# Patient Record
Sex: Female | Born: 1995 | Hispanic: No | Marital: Single | Smoking: Current every day smoker
Health system: Southern US, Community
[De-identification: ages and names within clinical notes are randomized; demographics above are authoritative.]

## PROBLEM LIST (undated history)

## (undated) ENCOUNTER — Inpatient Hospital Stay (HOSPITAL_COMMUNITY): Payer: Self-pay

## (undated) DIAGNOSIS — Z8759 Personal history of other complications of pregnancy, childbirth and the puerperium: Secondary | ICD-10-CM

## (undated) HISTORY — DX: Personal history of other complications of pregnancy, childbirth and the puerperium: Z87.59

## (undated) HISTORY — PX: OTHER SURGICAL HISTORY: SHX169

---

## 2016-11-08 ENCOUNTER — Encounter (HOSPITAL_COMMUNITY): Payer: Self-pay | Admitting: *Deleted

## 2016-11-08 ENCOUNTER — Inpatient Hospital Stay (HOSPITAL_COMMUNITY)
Admission: AD | Admit: 2016-11-08 | Discharge: 2016-11-08 | Disposition: A | Discharge status: Home / Self Care | Payer: Self-pay | Source: Ambulatory Visit | Attending: Obstetrics and Gynecology | Admitting: Obstetrics and Gynecology

## 2016-11-08 DIAGNOSIS — F17200 Nicotine dependence, unspecified, uncomplicated: Secondary | ICD-10-CM | POA: Insufficient documentation

## 2016-11-08 DIAGNOSIS — Z880 Allergy status to penicillin: Secondary | ICD-10-CM | POA: Insufficient documentation

## 2016-11-08 DIAGNOSIS — N730 Acute parametritis and pelvic cellulitis: Secondary | ICD-10-CM

## 2016-11-08 DIAGNOSIS — B9689 Other specified bacterial agents as the cause of diseases classified elsewhere: Secondary | ICD-10-CM

## 2016-11-08 DIAGNOSIS — N760 Acute vaginitis: Secondary | ICD-10-CM

## 2016-11-08 DIAGNOSIS — N739 Female pelvic inflammatory disease, unspecified: Secondary | ICD-10-CM | POA: Insufficient documentation

## 2016-11-08 DIAGNOSIS — K5900 Constipation, unspecified: Secondary | ICD-10-CM | POA: Insufficient documentation

## 2016-11-08 LAB — WET PREP, GENITAL
SPERM: NONE SEEN
TRICH WET PREP: NONE SEEN
YEAST WET PREP: NONE SEEN

## 2016-11-08 LAB — URINALYSIS, ROUTINE W REFLEX MICROSCOPIC
Bilirubin Urine: NEGATIVE
GLUCOSE, UA: NEGATIVE mg/dL
Hgb urine dipstick: NEGATIVE
KETONES UR: NEGATIVE mg/dL
Nitrite: POSITIVE — AB
PH: 7 (ref 5.0–8.0)
Protein, ur: NEGATIVE mg/dL
SPECIFIC GRAVITY, URINE: 1.015 (ref 1.005–1.030)

## 2016-11-08 LAB — POCT PREGNANCY, URINE: Preg Test, Ur: NEGATIVE

## 2016-11-08 MED ORDER — CEFTRIAXONE SODIUM 250 MG IJ SOLR
250.0000 mg | Freq: Once | INTRAMUSCULAR | Status: AC
  Administered 2016-11-08: 250 mg via INTRAMUSCULAR
  Filled 2016-11-08: qty 250

## 2016-11-08 MED ORDER — AZITHROMYCIN 250 MG PO TABS
1000.0000 mg | ORAL_TABLET | Freq: Once | ORAL | Status: AC
  Administered 2016-11-08: 1000 mg via ORAL
  Filled 2016-11-08: qty 4

## 2016-11-08 MED ORDER — AZITHROMYCIN 500 MG PO TABS: 1000.0000 mg | ORAL_TABLET | Freq: Once | ORAL | 0 refills | Status: AC

## 2016-11-08 MED ORDER — METRONIDAZOLE 500 MG PO TABS: 500.0000 mg | ORAL_TABLET | Freq: Two times a day (BID) | ORAL | 0 refills | Status: AC

## 2016-11-08 NOTE — MAU Note (Signed)
Pt presents to MAU with Periods have been light and irregular. Last regular period Jan 1.   Noticed an increase in d/c x 1 week and nipples are leaking and hurt.   Lower left and right abdominal pain.  Feels like someone is poking

## 2016-11-08 NOTE — Discharge Instructions (Signed)
Bacterial Vaginosis Bacterial vaginosis is an infection of the vagina. It happens when too many germs (bacteria) grow in the vagina. This infection puts you at risk for infections from sex (STIs). Treating this infection can lower your risk for some STIs. You should also treat this if you are pregnant. It can cause your baby to be born early. Follow these instructions at home: Medicines   Take over-the-counter and prescription medicines only as told by your doctor.  Take or use your antibiotic medicine as told by your doctor. Do not stop taking or using it even if you start to feel better. General instructions   If you your sexual partner is a woman, tell her that you have this infection. She needs to get treatment if she has symptoms. If you have a female partner, he does not need to be treated.  During treatment:  Avoid sex.  Do not douche.  Avoid alcohol as told.  Avoid breastfeeding as told.  Drink enough fluid to keep your pee (urine) clear or pale yellow.  Keep your vagina and butt (rectum) clean.  Wash the area with warm water every day.  Wipe from front to back after you use the toilet.  Keep all follow-up visits as told by your doctor. This is important. Preventing this condition   Do not douche.  Use only warm water to wash around your vagina.  Use protection when you have sex. This includes:  Latex condoms.  Dental dams.  Limit how many people you have sex with. It is best to only have sex with the same person (be monogamous).  Get tested for STIs. Have your partner get tested.  Wear underwear that is cotton or lined with cotton.  Avoid tight pants and pantyhose. This is most important in summer.  Do not use any products that have nicotine or tobacco in them. These include cigarettes and e-cigarettes. If you need help quitting, ask your doctor.  Do not use illegal drugs.  Limit how much alcohol you drink. Contact a doctor if:  Your symptoms do not  get better, even after you are treated.  You have more discharge or pain when you pee (urinate).  You have a fever.  You have pain in your belly (abdomen).  You have pain with sex.  Your bleed from your vagina between periods. Summary  This infection happens when too many germs (bacteria) grow in the vagina.  Treating this condition can lower your risk for some infections from sex (STIs).  You should also treat this if you are pregnant. It can cause early (premature) birth.  Do not stop taking or using your antibiotic medicine even if you start to feel better. This information is not intended to replace advice given to you by your health care provider. Make sure you discuss any questions you have with your health care provider. Document Released: 04/29/2008 Document Revised: 04/05/2016 Document Reviewed: 04/05/2016 Elsevier Interactive Patient Education  2017 Elsevier Inc.  Pelvic Inflammatory Disease Pelvic inflammatory disease (PID) is an infection in some or all of the female organs. PID can be in the uterus, ovaries, fallopian tubes, or the surrounding tissues that are inside the lower belly area (pelvis). PID can lead to lasting problems if it is not treated. To check for this disease, your doctor may:  Do a physical exam.  Do blood tests, urine tests, or a pregnancy test.  Look at your vaginal discharge.  Do tests to look inside the pelvis.  Test you for  other infections. Follow these instructions at home:  Take over-the-counter and prescription medicines only as told by your doctor.  If you were prescribed an antibiotic medicine, take it as told by your doctor. Do not stop taking it even if you start to feel better.  Do not have sex until treatment is done or as told by your doctor.  Tell your sex partner if you have PID. Your partner may need to be treated.  Keep all follow-up visits as told by your doctor. This is important.  Your doctor may test you for  infection again 3 months after you are treated. Contact a doctor if:  You have more fluid (discharge) coming from your vagina or fluid that is not normal.  Your pain does not improve.  You throw up (vomit).  You have a fever.  You cannot take your medicines.  Your partner has a sexually transmitted disease (STD).  You have pain when you pee (urinate). Get help right away if:  You have more belly (abdominal) or lower belly pain.  You have chills.  You are not better after 72 hours. This information is not intended to replace advice given to you by your health care provider. Make sure you discuss any questions you have with your health care provider. Document Released: 10/17/2008 Document Revised: 12/27/2015 Document Reviewed: 08/28/2014 Elsevier Interactive Patient Education  2017 [employer].

## 2016-11-08 NOTE — MAU Provider Note (Signed)
History     CSN: [id number]  Arrival date and time: 11/08/16 1059   First Provider Initiated Contact with Patient 11/08/16 1150      Chief Complaint  Patient presents with  . Abdominal Pain   Patient is a 21 year old G2 P1 presents today with concerns for pregnancy. Her pregnancy test however was negative. She reports she's been lactating for the past 3-4 days and has some abdominal distention as well as lower abdominal pain. She does complain of vaginal discharge. She states she's dissecting sexually active with multiple partners and does not use protection. She says she is not on any birth control because she lost custody of her first child and would like to have a child. She does report she has significant amount of stress in her life right now. Reports her discharge is white thin and potentially trichomoniasis. She has had this before. Additionally she states she like to be checked for gonorrhea and chlamydia as she is concerned she may have these.    OB History    Gravida Para Term Preterm AB Living   0 1 0   SAB TAB Ectopic Multiple Live Births       1   1      History reviewed. No pertinent past medical history.  Past Surgical History:  Procedure Laterality Date  . thumb surgery      No family history on file.  Social History  Substance Use Topics  . Smoking status: Current Every Day Smoker  . Smokeless tobacco: Never Used  . Alcohol use No    Allergies:  Allergies  Allergen Reactions  . Amoxicillin Anaphylaxis and Other (See Comments)    Has patient had a PCN reaction causing immediate rash, facial/tongue/throat swelling, SOB or lightheadedness with hypotension: Yes Has patient had a PCN reaction causing severe rash involving mucus membranes or skin necrosis: No Has patient had a PCN reaction that required hospitalization No Has patient had a PCN reaction occurring within the last 10 years: No If all of the above answers are "NO", then may proceed with  Cephalosporin use.  Marland Kitchen Penicillins Anaphylaxis and Other (See Comments)    Has patient had a PCN reaction causing immediate rash, facial/tongue/throat swelling, SOB or lightheadedness with hypotension: Yes Has patient had a PCN reaction causing severe rash involving mucus membranes or skin necrosis: No Has patient had a PCN reaction that required hospitalization No Has patient had a PCN reaction occurring within the last 10 years: No If all of the above answers are "NO", then may proceed with Cephalosporin use.    No prescriptions prior to admission.    Review of Systems  Constitutional: Negative for activity change, appetite change, chills and fever.  HENT: Negative for congestion and rhinorrhea.   Respiratory: Negative for cough and shortness of breath.   Cardiovascular: Negative for chest pain and palpitations.  Gastrointestinal: Positive for abdominal pain and constipation. Negative for abdominal distention, diarrhea, nausea and vomiting.  Genitourinary: Negative for difficulty urinating, dysuria and frequency.  Neurological: Negative for dizziness and weakness.   Physical Exam   Blood pressure 107/71, pulse 79, height  (1.626 m), weight 136 lb (61.7 kg), last menstrual period 08/04/2016.  Physical Exam  Constitutional: She is oriented to person, place, and time. She appears well-developed and well-nourished.  HENT:  Head: Normocephalic and atraumatic.  Cardiovascular: Normal rate and intact distal pulses.   Respiratory: Effort normal. No respiratory distress.  GI: Soft. She exhibits no distension.  There is tenderness. There is no rebound and no guarding.  Genitourinary:  Genitourinary Comments: Patient with cervical motion tenderness, copious thin white discharge, no vaginal bleeding, cervical os mildly irritated.  Neurological: She is alert and oriented to person, place, and time.  Skin: Skin is warm and dry.  Psychiatric: She has a normal mood and affect. Her behavior  is normal.    MAU Course  Procedures  MDM In MA U patient underwent evaluation for pregnancy. Pregnancy test was negative. Additionally she went underwent evaluation for PID with concerns for STDs and lower abdominal pain. She did have cervical motion tenderness and received a diagnosis of pelvic inflammatory disease. She was given a gram of azithromycin in 250 mg of Rocephin in the ER. She was prescribed an additional gram of azithromycin to take in 1 week.  Was performed and did reveal bacterial vaginosis. She was given 500 mg of Flagyl twice a day for 1 week.  GC and chlamydia pending.  Patient did also appear to have constipation and was instructed to get MiraLAX over-the-counter and take daily for several days.  Assessment and Plan  Pelvic inflammatory disease: Treat with azithromycin 1 g 21 week apart and Rocephin IM. BV: The with Flagyl 500 mg twice a day for 1 week Constipation: Treat with MiraLAX over-the-counter PRN  Anne Mcclain 11/08/2016, 12:27 PM

## 2016-11-10 LAB — GC/CHLAMYDIA PROBE AMP (~~LOC~~) NOT AT ARMC
CHLAMYDIA, DNA PROBE: NEGATIVE
NEISSERIA GONORRHEA: NEGATIVE

## 2016-12-24 NOTE — Congregational Nurse Program (Signed)
Congregational Nurse Program Note  Date of Encounter: 12/22/2016  Past Medical History: No past medical history on file.  Encounter Details:     CNP Questionnaire - 12/22/16 1626      Patient Demographics   Is this a new or existing patient? New   Patient is considered a/an Not Applicable   Race American Indian/[place] Native     Patient Assistance   Location of Patient Assistance Not Applicable   Patient's financial/insurance status Low Income;Medicaid   Uninsured Patient ([employer], [employer]Card/[employer]s) No   Patient referred to apply for the following financial assistance Not Applicable   Food insecurities addressed Not Applicable   Transportation assistance No   Assistance securing medications No   Educational health offerings Acute disease;Navigating the healthcare system;Safety     Encounter Details   Primary purpose of visit Acute Illness/Condition Visit;Navigating the Healthcare System   Was an Emergency Department visit averted? Not Applicable   Does patient have a medical provider? No   Patient referred to Area Agency   Was a mental health screening completed? (GAINS tool) No   Does patient have dental issues? No   Does patient have vision issues? No   Does your patient have an abnormal blood pressure today? No   Since previous encounter, have you referred patient for abnormal blood pressure that resulted in a new diagnosis or medication change? No   Does your patient have an abnormal blood glucose today? No   Since previous encounter, have you referred patient for abnormal blood glucose that resulted in a new diagnosis or medication change? No   Was there a life-saving intervention made? No     Client referred to see me by the [facility] Director, Tommi EmeryMichael Pearson.  Client states she believes she is pregnant and has positive pregnancy test.  She is also complaining of lower abdominal pain.  Client denies being pregnant before and did not disclose that [facility].  Provided resources for client to make appointment with the [facility].  Instructed to return to see me for bus passes once the appointment was made.  Client did not return

## 2017-02-02 ENCOUNTER — Encounter (HOSPITAL_COMMUNITY): Payer: Self-pay | Admitting: *Deleted

## 2017-02-02 ENCOUNTER — Inpatient Hospital Stay (HOSPITAL_COMMUNITY)
Admission: AD | Admit: 2017-02-02 | Discharge: 2017-02-02 | Discharge status: Against Medical Advice | Payer: Medicaid Other | Source: Ambulatory Visit | Attending: Obstetrics & Gynecology | Admitting: Obstetrics & Gynecology

## 2017-02-02 DIAGNOSIS — O26899 Other specified pregnancy related conditions, unspecified trimester: Secondary | ICD-10-CM | POA: Insufficient documentation

## 2017-02-02 DIAGNOSIS — R109 Unspecified abdominal pain: Secondary | ICD-10-CM | POA: Insufficient documentation

## 2017-02-02 LAB — URINALYSIS, ROUTINE W REFLEX MICROSCOPIC
BILIRUBIN URINE: NEGATIVE
Glucose, UA: NEGATIVE mg/dL
Hgb urine dipstick: NEGATIVE
KETONES UR: 20 mg/dL — AB
Nitrite: NEGATIVE
PH: 5 (ref 5.0–8.0)
Protein, ur: 30 mg/dL — AB
Specific Gravity, Urine: 1.03 (ref 1.005–1.030)

## 2017-02-02 LAB — POCT PREGNANCY, URINE: PREG TEST UR: POSITIVE — AB

## 2017-02-02 NOTE — MAU Note (Signed)
Lab called patient to have labs drawn, pt not in lobby for first call.

## 2017-02-02 NOTE — MAU Note (Signed)
Pt not in lobby for third call.

## 2017-02-02 NOTE — MAU Note (Signed)
Pt not in lobby for 2nd call

## 2017-02-02 NOTE — MAU Note (Signed)
+  lower abdominal pain Sharp in nature and comes/goes Started 2 days ago Rating pain 6/10  LMP 11/02/2016  Denies vaginal bleeding or discharge

## 2017-02-09 ENCOUNTER — Inpatient Hospital Stay (HOSPITAL_COMMUNITY): Payer: Medicaid Other

## 2017-02-09 ENCOUNTER — Encounter (HOSPITAL_COMMUNITY): Payer: Self-pay | Admitting: *Deleted

## 2017-02-09 ENCOUNTER — Inpatient Hospital Stay (HOSPITAL_COMMUNITY)
Admission: AD | Admit: 2017-02-09 | Discharge: 2017-02-09 | Disposition: A | Discharge status: Home / Self Care | Payer: Medicaid Other | Source: Ambulatory Visit | Attending: Obstetrics & Gynecology | Admitting: Obstetrics & Gynecology

## 2017-02-09 DIAGNOSIS — O23512 Infections of cervix in pregnancy, second trimester: Secondary | ICD-10-CM | POA: Insufficient documentation

## 2017-02-09 DIAGNOSIS — O26892 Other specified pregnancy related conditions, second trimester: Secondary | ICD-10-CM | POA: Insufficient documentation

## 2017-02-09 DIAGNOSIS — N72 Inflammatory disease of cervix uteri: Secondary | ICD-10-CM | POA: Insufficient documentation

## 2017-02-09 DIAGNOSIS — Z3A14 14 weeks gestation of pregnancy: Secondary | ICD-10-CM | POA: Insufficient documentation

## 2017-02-09 DIAGNOSIS — O26899 Other specified pregnancy related conditions, unspecified trimester: Secondary | ICD-10-CM

## 2017-02-09 DIAGNOSIS — R109 Unspecified abdominal pain: Secondary | ICD-10-CM | POA: Insufficient documentation

## 2017-02-09 LAB — URINALYSIS, ROUTINE W REFLEX MICROSCOPIC
BILIRUBIN URINE: NEGATIVE
GLUCOSE, UA: NEGATIVE mg/dL
Ketones, ur: NEGATIVE mg/dL
NITRITE: NEGATIVE
PROTEIN: NEGATIVE mg/dL
SPECIFIC GRAVITY, URINE: 1.003 — AB (ref 1.005–1.030)
pH: 7 (ref 5.0–8.0)

## 2017-02-09 LAB — CBC WITH DIFFERENTIAL/PLATELET
Basophils Absolute: 0 10*3/uL (ref 0.0–0.1)
Basophils Relative: 0 %
EOS ABS: 0.1 10*3/uL (ref 0.0–0.7)
EOS PCT: 1 %
HCT: 29.3 % — ABNORMAL LOW (ref 36.0–46.0)
Hemoglobin: 9.8 g/dL — ABNORMAL LOW (ref 12.0–15.0)
LYMPHS ABS: 1.9 10*3/uL (ref 0.7–4.0)
LYMPHS PCT: 20 %
MCH: 30.2 pg (ref 26.0–34.0)
MCHC: 33.4 g/dL (ref 30.0–36.0)
MCV: 90.2 fL (ref 78.0–100.0)
MONO ABS: 0.4 10*3/uL (ref 0.1–1.0)
Monocytes Relative: 4 %
Neutro Abs: 7 10*3/uL (ref 1.7–7.7)
Neutrophils Relative %: 75 %
PLATELETS: 200 10*3/uL (ref 150–400)
RBC: 3.25 MIL/uL — AB (ref 3.87–5.11)
RDW: 15.3 % (ref 11.5–15.5)
WBC: 9.4 10*3/uL (ref 4.0–10.5)

## 2017-02-09 LAB — WET PREP, GENITAL
Clue Cells Wet Prep HPF POC: NONE SEEN
Sperm: NONE SEEN
Trich, Wet Prep: NONE SEEN
YEAST WET PREP: NONE SEEN

## 2017-02-09 MED ORDER — OXYCODONE-ACETAMINOPHEN 5-325 MG PO TABS
1.0000 | ORAL_TABLET | Freq: Once | ORAL | Status: AC
  Administered 2017-02-09: 1 via ORAL
  Filled 2017-02-09: qty 1

## 2017-02-09 MED ORDER — CEFTRIAXONE SODIUM 250 MG IJ SOLR
250.0000 mg | Freq: Once | INTRAMUSCULAR | Status: AC
  Administered 2017-02-09: 250 mg via INTRAMUSCULAR
  Filled 2017-02-09: qty 250

## 2017-02-09 MED ORDER — AZITHROMYCIN 250 MG PO TABS
1000.0000 mg | ORAL_TABLET | Freq: Once | ORAL | Status: AC
  Administered 2017-02-09: 1000 mg via ORAL
  Filled 2017-02-09: qty 4

## 2017-02-09 NOTE — Discharge Instructions (Signed)
Abdominal Pain During Pregnancy Abdominal pain is common in pregnancy. Most of the time, it does not cause harm. There are many causes of abdominal pain. Some causes are more serious than others and sometimes the cause is not known. Abdominal pain can be a sign that something is very wrong with the pregnancy or the pain may have nothing to do with the pregnancy. Always tell your health care provider if you have any abdominal pain. Follow these instructions at home:  Do not have sex or put anything in your vagina until your symptoms go away completely.  Watch your abdominal pain for any changes.  Get plenty of rest until your pain improves.  Drink enough fluid to keep your urine clear or pale yellow.  Take over-the-counter or prescription medicines only as told by your health care provider.  Keep all follow-up visits as told by your health care provider. This is important. Contact a health care provider if:  You have a fever.  Your pain gets worse or you have cramping.  Your pain continues after resting. Get help right away if:  You are bleeding, leaking fluid, or passing tissue from the vagina.  You have vomiting or diarrhea that does not go away.  You have painful or bloody urination.  You notice a decrease in your baby's movements.  You feel very weak or faint.  You have shortness of breath.  You develop a severe headache with abdominal pain.  You have abnormal vaginal discharge with abdominal pain. This information is not intended to replace advice given to you by your health care provider. Make sure you discuss any questions you have with your health care provider. Document Released: 07/21/2005 Document Revised: 05/01/2016 Document Reviewed: 02/17/2013 Elsevier Interactive Patient Education  2018 [employer]  Cervicitis Cervicitis is when the cervix gets irritated and swollen. Your cervix is the lower end of your uterus. Follow these instructions at home:  Do not  have sex until your doctor says it is okay.  Take over-the-counter and prescription medicines only as told by your doctor.  If you were prescribed an antibiotic medicine, take it as told by your doctor. Do not stop taking it even if you start to feel better.  Keep all follow-up visits as told by your doctor. This is important. Contact a doctor if:  Your symptoms come back after treatment.  Your symptoms get worse after treatment.  You have a fever.  You feel tired (fatigued).  Your belly (abdomen) hurts.  You feel like you are going to throw up (are nauseous).  You throw up (vomit).  You have watery poop (diarrhea).  Your back hurts. Get help right away if:  You have very bad pain in your belly, and medicine does not help it.  You cannot pee (urinate). Summary  Cervicitis is when the cervix gets irritated and swollen.  Do not have sex until your doctor says it is okay.  If you need to take an antibiotic, do not stop taking even if you start to feel better. Take medicines only as told by your doctor. This information is not intended to replace advice given to you by your health care provider. Make sure you discuss any questions you have with your health care provider. Document Released: 04/29/2008 Document Revised: 04/06/2016 Document Reviewed: 04/06/2016 Elsevier Interactive Patient Education  2017 [employer]

## 2017-02-09 NOTE — MAU Note (Signed)
Question pt regarding history of Penicillin allergy and prior receiving Rocephin injection. Pt denied getting injection. Pt. Stepped out into hallway and confirmed that she did get a Rocephin injection back in April 2018, did not want significant other in the room to know. No reaction at that time.

## 2017-02-09 NOTE — MAU Note (Signed)
Pt here with c/o right sided-abdominal pain started about 0300; Denies any bleeding; frequent urination, pressure.

## 2017-02-09 NOTE — MAU Provider Note (Signed)
History     CSN: [id number]  Arrival date and time: 02/09/17 4098   First Provider Initiated Contact with Patient 02/09/17 [phone]      Chief Complaint  Patient presents with  . Abdominal Pain    right side   HPI Anne Mcclain is a 21 y.o. G3P1010 at [redacted]w[redacted]d who presents with lower abdominal pain that woke her out of her sleep at 0300. She states the pain is mostly on the right side but also all along the bottom part of her abdomen. She denies any vaginal bleeding or leaking of fluid. She reports urinary frequency.   Says she was treated for PID in April 2018 however did not complete the recommended treatment.   OB History    Gravida Para Term Preterm AB Living   3 1 1  0 1 0   SAB TAB Ectopic Multiple Live Births       1   1      History reviewed. No pertinent past medical history.  Past Surgical History:  Procedure Laterality Date  . thumb surgery      History reviewed. No pertinent family history.  Social History  Substance Use Topics  . Smoking status: Current Every Day Smoker    Types: Cigarettes  . Smokeless tobacco: Never Used  . Alcohol use No    Allergies:  Allergies  Allergen Reactions  . Amoxicillin Anaphylaxis and Other (See Comments)    Has patient had a PCN reaction causing immediate rash, facial/tongue/throat swelling, SOB or lightheadedness with hypotension: Yes Has patient had a PCN reaction causing severe rash involving mucus membranes or skin necrosis: No Has patient had a PCN reaction that required hospitalization No Has patient had a PCN reaction occurring within the last 10 years: No If all of the above answers are "NO", then may proceed with Cephalosporin use.  Marland Kitchen Penicillins Anaphylaxis and Other (See Comments)    Has patient had a PCN reaction causing immediate rash, facial/tongue/throat swelling, SOB or lightheadedness with hypotension: Yes Has patient had a PCN reaction causing severe rash involving mucus membranes or skin necrosis:  No Has patient had a PCN reaction that required hospitalization No Has patient had a PCN reaction occurring within the last 10 years: No If all of the above answers are "NO", then may proceed with Cephalosporin use. Pt reports receiving rocephin in the past with no reactions    Prescriptions Prior to Admission  Medication Sig Dispense Refill Last Dose  . aspirin 81 MG chewable tablet Chew by mouth daily.   02/09/2017 at Unknown time  . pseudoephedrine-acetaminophen (TYLENOL SINUS) 30-500 MG TABS tablet Take 1 tablet by mouth every 4 (four) hours as needed.       Review of Systems  Constitutional: Negative.  Negative for chills and fever.  HENT: Negative.   Respiratory: Negative.  Negative for shortness of breath.   Cardiovascular: Negative.  Negative for chest pain.  Gastrointestinal: Positive for abdominal pain. Negative for constipation, diarrhea, nausea and vomiting.  Genitourinary: Positive for frequency. Negative for dysuria, vaginal bleeding and vaginal discharge.  Neurological: Negative.  Negative for dizziness and headaches.  Psychiatric/Behavioral: Negative.    Physical Exam   Blood pressure 126/79, pulse 91, temperature 98 F (36.7 C), temperature source Oral, resp. rate 18, height 5\' 4"  (1.626 m), weight 134 lb (60.8 kg), last menstrual period 11/02/2016, SpO2 100 %.  Physical Exam  Nursing note and vitals reviewed. Constitutional: She appears well-developed and well-nourished.  HENT:  Head: Normocephalic and atraumatic.  Eyes: Conjunctivae are normal. No scleral icterus.  Cardiovascular: Normal rate, regular rhythm and normal heart sounds.   Respiratory: Effort normal and breath sounds normal. No respiratory distress.  GI: Soft. She exhibits no distension. There is tenderness. There is guarding.  Genitourinary: Cervix exhibits motion tenderness, discharge and friability. There is tenderness in the vagina. No bleeding in the vagina. Vaginal discharge found.   Neurological: She is alert.  Skin: Skin is warm and dry.  Psychiatric: She has a normal mood and affect. Her behavior is normal. Judgment and thought content normal.   Pelvic exam: Cervix with erythema and friability, visually closed, moderate white creamy discharge, vaginal walls and external genitalia normal Bimanual exam: Cervix 0/long/high, firm, anterior, positive CMT, uterus tender, adnexa with tenderness, no enlargement, or mass  FHT: 151 MAU Course  Procedures Results for orders placed or performed during the hospital encounter of 02/09/17 (from the past 24 hour(s))  Urinalysis, Routine w reflex microscopic     Status: Abnormal   Collection Time: 02/09/17  6:35 AM  Result Value Ref Range   Color, Urine STRAW (A) YELLOW   APPearance CLEAR CLEAR   Specific Gravity, Urine 1.003 (L) 1.005 - 1.030   pH 7.0 5.0 - 8.0   Glucose, UA NEGATIVE NEGATIVE mg/dL   Hgb urine dipstick LARGE (A) NEGATIVE   Bilirubin Urine NEGATIVE NEGATIVE   Ketones, ur NEGATIVE NEGATIVE mg/dL   Protein, ur NEGATIVE NEGATIVE mg/dL   Nitrite NEGATIVE NEGATIVE   Leukocytes, UA SMALL (A) NEGATIVE   RBC / HPF 0-5 0 - 5 RBC/hpf   WBC, UA 6-30 0 - 5 WBC/hpf   Bacteria, UA RARE (A) NONE SEEN   Squamous Epithelial / LPF 0-5 (A) NONE SEEN   Mucous PRESENT   CBC with Differential/Platelet     Status: Abnormal   Collection Time: 02/09/17  7:04 AM  Result Value Ref Range   WBC 9.4 4.0 - 10.5 K/uL   RBC 3.25 (L) 3.87 - 5.11 MIL/uL   Hemoglobin 9.8 (L) 12.0 - 15.0 g/dL   HCT 16.1 (L) 09.6 - 04.5 %   MCV 90.2 78.0 - 100.0 fL   MCH 30.2 26.0 - 34.0 pg   MCHC 33.4 30.0 - 36.0 g/dL   RDW 40.9 81.1 - 91.4 %   Platelets 200 150 - 400 K/uL   Neutrophils Relative % 75 %   Neutro Abs 7.0 1.7 - 7.7 K/uL   Lymphocytes Relative 20 %   Lymphs Abs 1.9 0.7 - 4.0 K/uL   Monocytes Relative 4 %   Monocytes Absolute 0.4 0.1 - 1.0 K/uL   Eosinophils Relative 1 %   Eosinophils Absolute 0.1 0.0 - 0.7 K/uL   Basophils Relative  0 %   Basophils Absolute 0.0 0.0 - 0.1 K/uL  Wet prep, genital     Status: Abnormal   Collection Time: 02/09/17  7:40 AM  Result Value Ref Range   Yeast Wet Prep HPF POC NONE SEEN NONE SEEN   Trich, Wet Prep NONE SEEN NONE SEEN   Clue Cells Wet Prep HPF POC NONE SEEN NONE SEEN   WBC, Wet Prep HPF POC MODERATE (A) NONE SEEN   Sperm NONE SEEN     No results found.  MDM UA CBC w/ Diff Wet prep and gc/chlamydia 1 percocet PO US OB Comp Less 14 weeks  Care turned over to J. Charmagne Buhl NP at 0800. Care resumed at 0800 Santiago Stenzel, Harolyn Rutherford, NP  Preliminary US shows no acute findings.  Azithromycin  1 gram given PO Rocephin 250 mg given IM.  Pain improved with Percocet.   Assessment and Plan    Cleone SlimCaroline Neill SNM 02/09/2017, 8:08 AM   A:  1. Cervicitis   2. Abdominal pain affecting pregnancy     P:  Discharge home in stable condition Condoms always Return to MAU if symptoms worsen Partner needs full STI assessment at the HD    Nasirah Sachs, Victorino DikeJennifer I, NP 02/09/2017 11:39 AM

## 2017-02-10 LAB — GC/CHLAMYDIA PROBE AMP (~~LOC~~) NOT AT ARMC
CHLAMYDIA, DNA PROBE: NEGATIVE
NEISSERIA GONORRHEA: NEGATIVE

## 2017-02-11 LAB — CULTURE, OB URINE

## 2017-02-12 ENCOUNTER — Other Ambulatory Visit: Payer: Self-pay | Admitting: Obstetrics & Gynecology

## 2017-02-12 ENCOUNTER — Other Ambulatory Visit (HOSPITAL_COMMUNITY): Payer: Self-pay | Admitting: Advanced Practice Midwife

## 2017-02-12 DIAGNOSIS — A498 Other bacterial infections of unspecified site: Secondary | ICD-10-CM

## 2017-02-12 MED ORDER — SULFAMETHOXAZOLE-TRIMETHOPRIM 800-160 MG PO TABS: 1.0000 | ORAL_TABLET | Freq: Two times a day (BID) | ORAL | 1 refills | Status: DC

## 2017-02-12 NOTE — Progress Notes (Unsigned)
+  UTI enterobacter  Had Rocephin  Sens to Bactrim  Will Rx Bactrim

## 2017-02-16 ENCOUNTER — Telehealth: Payer: Self-pay

## 2017-02-16 NOTE — Telephone Encounter (Signed)
Call patient no answer or voice mail to leave a message.  Bactrim DS prescribed for patient for UTI. Please call to inform patient of results and advise her to pick up prescription.

## 2017-03-03 NOTE — Telephone Encounter (Signed)
I called Adela LankJacqueline and left a message I am calling re: a prescription that was sent in and we need to discuss with you. Please call our office.

## 2017-03-04 ENCOUNTER — Encounter: Payer: Self-pay | Admitting: *Deleted

## 2017-03-04 NOTE — Telephone Encounter (Signed)
Letter to patient

## 2017-03-20 ENCOUNTER — Inpatient Hospital Stay (HOSPITAL_COMMUNITY)
Admission: AD | Admit: 2017-03-20 | Discharge: 2017-03-20 | Disposition: A | Discharge status: Home / Self Care | Payer: Medicaid Other | Source: Ambulatory Visit | Attending: Obstetrics and Gynecology | Admitting: Obstetrics and Gynecology

## 2017-03-20 ENCOUNTER — Encounter (HOSPITAL_COMMUNITY): Payer: Self-pay | Admitting: *Deleted

## 2017-03-20 DIAGNOSIS — Z3A20 20 weeks gestation of pregnancy: Secondary | ICD-10-CM | POA: Diagnosis not present

## 2017-03-20 DIAGNOSIS — K5900 Constipation, unspecified: Secondary | ICD-10-CM | POA: Insufficient documentation

## 2017-03-20 DIAGNOSIS — F17210 Nicotine dependence, cigarettes, uncomplicated: Secondary | ICD-10-CM | POA: Diagnosis not present

## 2017-03-20 DIAGNOSIS — Z880 Allergy status to penicillin: Secondary | ICD-10-CM | POA: Diagnosis not present

## 2017-03-20 DIAGNOSIS — D649 Anemia, unspecified: Secondary | ICD-10-CM | POA: Diagnosis not present

## 2017-03-20 DIAGNOSIS — O99012 Anemia complicating pregnancy, second trimester: Secondary | ICD-10-CM | POA: Diagnosis not present

## 2017-03-20 DIAGNOSIS — O99332 Smoking (tobacco) complicating pregnancy, second trimester: Secondary | ICD-10-CM | POA: Insufficient documentation

## 2017-03-20 DIAGNOSIS — Z7982 Long term (current) use of aspirin: Secondary | ICD-10-CM | POA: Diagnosis not present

## 2017-03-20 DIAGNOSIS — O2340 Unspecified infection of urinary tract in pregnancy, unspecified trimester: Secondary | ICD-10-CM

## 2017-03-20 DIAGNOSIS — O2342 Unspecified infection of urinary tract in pregnancy, second trimester: Secondary | ICD-10-CM | POA: Insufficient documentation

## 2017-03-20 DIAGNOSIS — R109 Unspecified abdominal pain: Secondary | ICD-10-CM | POA: Diagnosis present

## 2017-03-20 LAB — URINALYSIS, ROUTINE W REFLEX MICROSCOPIC
Bilirubin Urine: NEGATIVE
Glucose, UA: NEGATIVE mg/dL
Hgb urine dipstick: NEGATIVE
KETONES UR: NEGATIVE mg/dL
Nitrite: NEGATIVE
PH: 7 (ref 5.0–8.0)
PROTEIN: NEGATIVE mg/dL
Specific Gravity, Urine: 1.015 (ref 1.005–1.030)

## 2017-03-20 LAB — CBC WITH DIFFERENTIAL/PLATELET
BASOS ABS: 0 10*3/uL (ref 0.0–0.1)
BASOS PCT: 0 %
Eosinophils Absolute: 0.2 10*3/uL (ref 0.0–0.7)
Eosinophils Relative: 2 %
HEMATOCRIT: 25.2 % — AB (ref 36.0–46.0)
HEMOGLOBIN: 8.6 g/dL — AB (ref 12.0–15.0)
LYMPHS PCT: 34 %
Lymphs Abs: 3.1 10*3/uL (ref 0.7–4.0)
MCH: 30.8 pg (ref 26.0–34.0)
MCHC: 34.1 g/dL (ref 30.0–36.0)
MCV: 90.3 fL (ref 78.0–100.0)
MONO ABS: 0.3 10*3/uL (ref 0.1–1.0)
Monocytes Relative: 3 %
NEUTROS ABS: 5.5 10*3/uL (ref 1.7–7.7)
NEUTROS PCT: 61 %
Platelets: 190 10*3/uL (ref 150–400)
RBC: 2.79 MIL/uL — ABNORMAL LOW (ref 3.87–5.11)
RDW: 14.1 % (ref 11.5–15.5)
WBC: 9 10*3/uL (ref 4.0–10.5)

## 2017-03-20 LAB — WET PREP, GENITAL
Sperm: NONE SEEN
TRICH WET PREP: NONE SEEN
Yeast Wet Prep HPF POC: NONE SEEN

## 2017-03-20 MED ORDER — NITROFURANTOIN MONOHYD MACRO 100 MG PO CAPS: 100.0000 mg | ORAL_CAPSULE | Freq: Two times a day (BID) | ORAL | 0 refills | Status: DC

## 2017-03-20 MED ORDER — POLYSACCHARIDE IRON COMPLEX 150 MG PO CAPS: 150.0000 mg | ORAL_CAPSULE | Freq: Every day | ORAL | 2 refills | Status: DC

## 2017-03-20 MED ORDER — ACETAMINOPHEN 500 MG PO TABS
1000.0000 mg | ORAL_TABLET | Freq: Four times a day (QID) | ORAL | Status: DC | PRN
  Administered 2017-03-20: 1000 mg via ORAL
  Filled 2017-03-20: qty 2

## 2017-03-20 NOTE — MAU Note (Signed)
Pt presents to MAU via EMS from [facility] c/o right sided abdominal pain pt states that the pain is in her back and radiates down to her lower abdomen. Pt also states she has occasional pain on her left side. Pt is c/o lots of pressure in her bottom. Pt has a hx of ectopic pregnancy and 1 miscarriage at an unknown gestation. Pt is also c/o constipation and having urinary frequency and is not able to void occasionally. Pt states she has urinated on herself twice this week and is concerned its amniotic fluid. Pt c/o left foot swelling and she is unable to wiggle her toes like she is able to on her right side.

## 2017-03-20 NOTE — Discharge Instructions (Signed)
Pregnancy and Anemia Anemia is a condition in which the concentration of red blood cells or hemoglobin in the blood is below normal. Hemoglobin is a substance in red blood cells that carries oxygen to the tissues of the body. Anemia results in not enough oxygen reaching these tissues. Anemia during pregnancy is common because the fetus uses more iron and folic acid as it is developing. Your body may not produce enough red blood cells because of this. Also, during pregnancy, the liquid part of the blood (plasma) increases by about 50%, and the red blood cells increase by only 25%. This lowers the concentration of the red blood cells and creates a natural anemia-like situation. What are the causes? The most common cause of anemia during pregnancy is not having enough iron in the body to make red blood cells (iron deficiency anemia). Other causes may include:  Folic acid deficiency.  Vitamin B12 deficiency.  Certain prescription or over-the-counter medicines.  Certain medical conditions or infections that destroy red blood cells.  A low platelet count and bleeding caused by antibodies that go through the placenta to the fetus from the mothers blood.  What are the signs or symptoms? Mild anemia may not be noticeable. If it becomes severe, symptoms may include:  Tiredness.  Shortness of breath, especially with exercise.  Weakness.  Fainting.  Pale looking skin.  Headaches.  Feeling a fast or irregular heartbeat (palpitations).  How is this diagnosed? The type of anemia is usually diagnosed from your family and medical history and blood tests. How is this treated? Treatment of anemia during pregnancy depends on the cause of the anemia. Treatment can include:  Supplements of iron, vitamin B12, or folic acid.  A blood transfusion. This may be needed if blood loss is severe.  Hospitalization. This may be needed if there is significant continual blood loss.  Dietary  changes.  Follow these instructions at home:  Follow your dietitian's or health care provider's dietary recommendations.  Increase your vitamin C intake. This will help the stomach absorb more iron.  Eat a diet rich in iron. This would include foods such as: ? Liver. ? Beef. ? Whole grain bread. ? Eggs. ? Dried fruit.  Take iron and vitamins as directed by your health care provider.  Eat green leafy vegetables. These are a good source of folic acid. Contact a health care provider if:  You have frequent or lasting headaches.  You are looking pale.  You are bruising easily. Get help right away if:  You have extreme weakness, shortness of breath, or chest pain.  You become dizzy or have trouble concentrating.  You have heavy vaginal bleeding.  You develop a rash.  You have bloody or black, tarry stools.  You faint.  You vomit up blood.  You vomit repeatedly.  You have abdominal pain.  You have a fever or persistent symptoms for more than 2-3 days.  You have a fever and your symptoms suddenly get worse.  You are dehydrated. This information is not intended to replace advice given to you by your health care provider. Make sure you discuss any questions you have with your health care provider. Document Released: 07/18/2000 Document Revised: 12/27/2015 Document Reviewed: 03/02/2013 Elsevier Interactive Patient Education  2017 [employer] Pregnancy and Urinary Tract Infection What is a urinary tract infection? A urinary tract infection (UTI) is an infection of any part of the urinary tract. This includes the kidneys, the tubes that connect your kidneys to your bladder (ureters),  the bladder, and the tube that carries urine out of your body (urethra). These organs make, store, and get rid of urine in the body. A UTI can be a bladder infection (cystitis) or a kidney infection (pyelonephritis). This infection may be caused by fungi, viruses, and bacteria. Bacteria are  the most common cause of UTIs. You are more likely to develop a UTI during pregnancy because:  The physical and hormonal changes your body goes through can make it easier for bacteria to get into your urinary tract.  Your growing baby puts pressure on your uterus and can affect urine flow.  Does a UTI place my baby at risk? An untreated UTI during pregnancy could lead to a kidney infection, which can cause health problems that could affect your baby. Possible complications of an untreated UTI include:  Having your baby before 37 weeks of pregnancy (premature).  Having a baby with a low birth weight.  Developing high blood pressure during pregnancy (preeclampsia).  What are the symptoms of a UTI? Symptoms of a UTI include:  Fever.  Frequent urination or passing small amounts of urine frequently.  Needing to urinate urgently.  Pain or a burning sensation with urination.  Urine that smells bad or unusual.  Cloudy urine.  Pain in the lower abdomen or back.  Trouble urinating.  Blood in the urine.  Vomiting or being less hungry than normal.  Diarrhea or abdominal pain.  Vaginal discharge.  What are the treatment options for a UTI during pregnancy? Treatment for this condition may include:  Antibiotic medicines that are safe to take during pregnancy.  Other medicines to treat less common causes of UTI.  How can I prevent a UTI?  To prevent a UTI:  Go to the bathroom as soon as you feel the need.  Always wipe from front to back.  Wash your genital area with soap and warm water daily.  Empty your bladder before and after sex.  Wear cotton underwear.  Limit your intake of high sugar foods or drinks, such as regular soda, juice, and sweets.  Drink 6-8 glasses of water daily.  Do not wear tight-fitting pants.  Do not douche or use deodorant sprays.  Do not drink alcohol, caffeine, or carbonated drinks. These can irritate the bladder.  Contact a health  care provider if:  Your symptoms do not improve or get worse.  You have a fever after two days of treatment.  You have a rash.  You have abnormal vaginal discharge.  You have back or side pain.  You have chills.  You have nausea and vomiting. Get help right away if: Seek immediate medical care if you are pregnant and:  You feel contractions in your uterus.  You have lower belly pain.  You have a gush of fluid from your vagina.  You have blood in your urine.  You are vomiting and cannot keep down any medicines or water.  This information is not intended to replace advice given to you by your health care provider. Make sure you discuss any questions you have with your health care provider. Document Released: 11/15/2010 Document Revised: 07/04/2016 Document Reviewed: 06/11/2015 Elsevier Interactive Patient Education  2017 [employer].

## 2017-03-20 NOTE — MAU Provider Note (Signed)
History     CSN: [id number]  Arrival date and time: 03/20/17 2053   First Provider Initiated Contact with Patient 03/20/17 2111     Chief Complaint  Patient presents with  . Abdominal Pain   [id number] @20 .6 wks here with bilateral LAP and right flank pain. Pain started 2 days ago. Describes as sharp and intermittent. Has not used anything for it. No fevers. No dysuria or hematuria but reports worsening polyuria. No vaginal discharge or VB. +FM. Feels constipated, no BM in 2 days. Some nausea through pregnancy, no vomiting. Has not started [facility] yet.    OB History    Gravida Para Term Preterm AB Living   4 1 1  0 2 1   SAB TAB Ectopic Multiple Live Births   1   1   1       History reviewed. No pertinent past medical history.  Past Surgical History:  Procedure Laterality Date  . thumb surgery      Family History  Problem Relation Age of Onset  . Miscarriages / [place] Mother   . Miscarriages / Stillbirths Maternal Grandmother     Social History  Substance Use Topics  . Smoking status: Current Every Day Smoker    Packs/day: 0.25    Types: Cigarettes  . Smokeless tobacco: Never Used  . Alcohol use No    Allergies:  Allergies  Allergen Reactions  . Amoxicillin Anaphylaxis and Other (See Comments)    Has patient had a PCN reaction causing immediate rash, facial/tongue/throat swelling, SOB or lightheadedness with hypotension: Yes Has patient had a PCN reaction causing severe rash involving mucus membranes or skin necrosis: No Has patient had a PCN reaction that required hospitalization No Has patient had a PCN reaction occurring within the last 10 years: No If all of the above answers are "NO", then may proceed with Cephalosporin use.  Marland Kitchen Penicillins Anaphylaxis and Other (See Comments)    Has patient had a PCN reaction causing immediate rash, facial/tongue/throat swelling, SOB or lightheadedness with hypotension: Yes Has patient had a PCN reaction causing severe rash  involving mucus membranes or skin necrosis: No Has patient had a PCN reaction that required hospitalization No Has patient had a PCN reaction occurring within the last 10 years: No If all of the above answers are "NO", then may proceed with Cephalosporin use. Pt reports receiving rocephin in the past with no reactions    Prescriptions Prior to Admission  Medication Sig Dispense Refill Last Dose  . Prenatal Vit-Fe Fumarate-FA (PRENATAL MULTIVITAMIN) TABS tablet Take 1 tablet by mouth daily at 12 noon.   03/20/2017 at Unknown time  . aspirin 81 MG chewable tablet Chew 81 mg by mouth daily as needed for mild pain.    02/08/2017 at Unknown time  . sulfamethoxazole-trimethoprim (BACTRIM DS,SEPTRA DS) 800-160 MG tablet Take 1 tablet by mouth 2 (two) times daily. 14 tablet 1     Review of Systems  Constitutional: Negative for chills and fever.  Gastrointestinal: Positive for abdominal pain, constipation and nausea. Negative for diarrhea and vomiting.  Genitourinary: Positive for frequency and urgency. Negative for dysuria, hematuria, vaginal bleeding and vaginal discharge.   Physical Exam   Blood pressure 107/60, pulse 98, temperature 97.8 F (36.6 C), temperature source Oral, resp. rate 16, height 5\' 4"  (1.626 m), weight 134 lb (60.8 kg), last menstrual period 11/02/2016.  Physical Exam  Constitutional: She is oriented to person, place, and time. She appears well-developed and well-nourished. No distress (appears comfortable).  HENT:  Head: Normocephalic and atraumatic.  Neck: Normal range of motion.  Respiratory: Effort normal. No respiratory distress.  GI: Soft. She exhibits no distension and no mass. There is tenderness (all quadrants). There is no rebound, no guarding and no CVA tenderness.  gravid  Genitourinary:  Genitourinary Comments: External: no lesions or erythema Vagina: rugated, pink, moist, scant white discharge Cervix closed/thick   Musculoskeletal: Normal range of motion.         Lumbar back: She exhibits tenderness (right).       Back:  Neurological: She is alert and oriented to person, place, and time.  Skin: Skin is warm and dry.  Psychiatric: She has a normal mood and affect.  FHT: 154  Results for orders placed or performed during the hospital encounter of 03/20/17 (from the past 24 hour(s))  Urinalysis, Routine w reflex microscopic     Status: Abnormal   Collection Time: 03/20/17  9:15 PM  Result Value Ref Range   Color, Urine YELLOW YELLOW   APPearance HAZY (A) CLEAR   Specific Gravity, Urine 1.015 1.005 - 1.030   pH 7.0 5.0 - 8.0   Glucose, UA NEGATIVE NEGATIVE mg/dL   Hgb urine dipstick NEGATIVE NEGATIVE   Bilirubin Urine NEGATIVE NEGATIVE   Ketones, ur NEGATIVE NEGATIVE mg/dL   Protein, ur NEGATIVE NEGATIVE mg/dL   Nitrite NEGATIVE NEGATIVE   Leukocytes, UA MODERATE (A) NEGATIVE   RBC / HPF 0-5 0 - 5 RBC/hpf   WBC, UA TOO NUMEROUS TO COUNT 0 - 5 WBC/hpf   Bacteria, UA RARE (A) NONE SEEN   Squamous Epithelial / LPF 0-5 (A) NONE SEEN   Mucous PRESENT    Amorphous Crystal PRESENT   Wet prep, genital     Status: Abnormal   Collection Time: 03/20/17  9:16 PM  Result Value Ref Range   Yeast Wet Prep HPF POC NONE SEEN NONE SEEN   Trich, Wet Prep NONE SEEN NONE SEEN   Clue Cells Wet Prep HPF POC PRESENT (A) NONE SEEN   WBC, Wet Prep HPF POC MANY (A) NONE SEEN   Sperm NONE SEEN   CBC with Differential/Platelet     Status: Abnormal   Collection Time: 03/20/17  9:39 PM  Result Value Ref Range   WBC 9.0 4.0 - 10.5 K/uL   RBC 2.79 (L) 3.87 - 5.11 MIL/uL   Hemoglobin 8.6 (L) 12.0 - 15.0 g/dL   HCT 16.1 (L) 09.6 - 04.5 %   MCV 90.3 78.0 - 100.0 fL   MCH 30.8 26.0 - 34.0 pg   MCHC 34.1 30.0 - 36.0 g/dL   RDW 40.9 81.1 - 91.4 %   Platelets 190 150 - 400 K/uL   Neutrophils Relative % 61 %   Neutro Abs 5.5 1.7 - 7.7 K/uL   Lymphocytes Relative 34 %   Lymphs Abs 3.1 0.7 - 4.0 K/uL   Monocytes Relative 3 %   Monocytes Absolute 0.3 0.1 - 1.0  K/uL   Eosinophils Relative 2 %   Eosinophils Absolute 0.2 0.0 - 0.7 K/uL   Basophils Relative 0 %   Basophils Absolute 0.0 0.0 - 0.1 K/uL   MAU Course  Procedures Tylenol Heating pad  MDM Labs ordered and reviewed. Pain improved after Tylenol. No evidence of pyelo or PTL. Will treat UTI. UC sent. Stable for discharge home.   Assessment and Plan   1. [redacted] weeks gestation of pregnancy   2. Urinary tract infection in mother during second trimester of pregnancy   3.  Anemia during pregnancy in second trimester   4.      Constipation  Discharge home Follow up with OB provider of choice asap to start care-list given Rx Macrobid Rx Fe Miralax OTC Increase water intake and dietary fiber Add cranberry juice bid Return for worsening sx  Allergies as of 03/20/2017      Reactions   Amoxicillin Anaphylaxis, Other (See Comments)   Has patient had a PCN reaction causing immediate rash, facial/tongue/throat swelling, SOB or lightheadedness with hypotension: Yes Has patient had a PCN reaction causing severe rash involving mucus membranes or skin necrosis: No Has patient had a PCN reaction that required hospitalization No Has patient had a PCN reaction occurring within the last 10 years: No If all of the above answers are "NO", then may proceed with Cephalosporin use.   Penicillins Anaphylaxis, Other (See Comments)   Has patient had a PCN reaction causing immediate rash, facial/tongue/throat swelling, SOB or lightheadedness with hypotension: Yes Has patient had a PCN reaction causing severe rash involving mucus membranes or skin necrosis: No Has patient had a PCN reaction that required hospitalization No Has patient had a PCN reaction occurring within the last 10 years: No If all of the above answers are "NO", then may proceed with Cephalosporin use. Pt reports receiving rocephin in the past with no reactions      Medication List    STOP taking these medications   aspirin 81 MG chewable  tablet   sulfamethoxazole-trimethoprim 800-160 MG tablet Commonly known as:  BACTRIM DS,SEPTRA DS     TAKE these medications   iron polysaccharides 150 MG capsule Commonly known as:  NIFEREX Take 1 capsule (150 mg total) by mouth daily.   nitrofurantoin (macrocrystal-monohydrate) 100 MG capsule Commonly known as:  MACROBID Take 1 capsule (100 mg total) by mouth 2 (two) times daily.   prenatal multivitamin Tabs tablet Take 1 tablet by mouth daily at 12 noon.      Donette Larry, CNM 03/20/2017, 9:22 PM

## 2017-03-23 ENCOUNTER — Encounter: Payer: Self-pay | Admitting: Obstetrics and Gynecology

## 2017-03-23 DIAGNOSIS — O0930 Supervision of pregnancy with insufficient antenatal care, unspecified trimester: Secondary | ICD-10-CM | POA: Insufficient documentation

## 2017-03-23 DIAGNOSIS — O99019 Anemia complicating pregnancy, unspecified trimester: Secondary | ICD-10-CM | POA: Insufficient documentation

## 2017-03-23 LAB — GC/CHLAMYDIA PROBE AMP (~~LOC~~) NOT AT ARMC
CHLAMYDIA, DNA PROBE: NEGATIVE
Neisseria Gonorrhea: NEGATIVE

## 2017-03-23 LAB — CULTURE, OB URINE

## 2017-03-25 DIAGNOSIS — O2340 Unspecified infection of urinary tract in pregnancy, unspecified trimester: Secondary | ICD-10-CM

## 2017-03-27 ENCOUNTER — Inpatient Hospital Stay (HOSPITAL_COMMUNITY)
Admission: AD | Admit: 2017-03-27 | Discharge: 2017-03-27 | Disposition: A | Discharge status: Home / Self Care | Payer: Medicaid Other | Source: Ambulatory Visit | Attending: Obstetrics and Gynecology | Admitting: Obstetrics and Gynecology

## 2017-03-27 ENCOUNTER — Inpatient Hospital Stay (HOSPITAL_COMMUNITY): Payer: Medicaid Other

## 2017-03-27 ENCOUNTER — Encounter (HOSPITAL_COMMUNITY): Payer: Self-pay | Admitting: *Deleted

## 2017-03-27 DIAGNOSIS — F17210 Nicotine dependence, cigarettes, uncomplicated: Secondary | ICD-10-CM | POA: Insufficient documentation

## 2017-03-27 DIAGNOSIS — O2342 Unspecified infection of urinary tract in pregnancy, second trimester: Secondary | ICD-10-CM | POA: Diagnosis not present

## 2017-03-27 DIAGNOSIS — O26892 Other specified pregnancy related conditions, second trimester: Secondary | ICD-10-CM

## 2017-03-27 DIAGNOSIS — Z880 Allergy status to penicillin: Secondary | ICD-10-CM | POA: Insufficient documentation

## 2017-03-27 DIAGNOSIS — O0932 Supervision of pregnancy with insufficient antenatal care, second trimester: Secondary | ICD-10-CM | POA: Insufficient documentation

## 2017-03-27 DIAGNOSIS — O26899 Other specified pregnancy related conditions, unspecified trimester: Secondary | ICD-10-CM

## 2017-03-27 DIAGNOSIS — O99012 Anemia complicating pregnancy, second trimester: Secondary | ICD-10-CM | POA: Insufficient documentation

## 2017-03-27 DIAGNOSIS — R109 Unspecified abdominal pain: Secondary | ICD-10-CM

## 2017-03-27 DIAGNOSIS — D649 Anemia, unspecified: Secondary | ICD-10-CM | POA: Insufficient documentation

## 2017-03-27 DIAGNOSIS — O99332 Smoking (tobacco) complicating pregnancy, second trimester: Secondary | ICD-10-CM | POA: Insufficient documentation

## 2017-03-27 DIAGNOSIS — Z3A20 20 weeks gestation of pregnancy: Secondary | ICD-10-CM | POA: Diagnosis not present

## 2017-03-27 DIAGNOSIS — M545 Low back pain: Secondary | ICD-10-CM | POA: Diagnosis present

## 2017-03-27 LAB — URINALYSIS, ROUTINE W REFLEX MICROSCOPIC
BILIRUBIN URINE: NEGATIVE
Glucose, UA: NEGATIVE mg/dL
Ketones, ur: NEGATIVE mg/dL
Nitrite: NEGATIVE
PH: 8 (ref 5.0–8.0)
Protein, ur: NEGATIVE mg/dL
SPECIFIC GRAVITY, URINE: 1.013 (ref 1.005–1.030)

## 2017-03-27 LAB — CBC WITH DIFFERENTIAL/PLATELET
BASOS PCT: 0 %
Basophils Absolute: 0 10*3/uL (ref 0.0–0.1)
EOS ABS: 0.1 10*3/uL (ref 0.0–0.7)
Eosinophils Relative: 1 %
HEMATOCRIT: 26.8 % — AB (ref 36.0–46.0)
HEMOGLOBIN: 9.2 g/dL — AB (ref 12.0–15.0)
LYMPHS ABS: 2.8 10*3/uL (ref 0.7–4.0)
Lymphocytes Relative: 28 %
MCH: 31.1 pg (ref 26.0–34.0)
MCHC: 34.3 g/dL (ref 30.0–36.0)
MCV: 90.5 fL (ref 78.0–100.0)
MONOS PCT: 3 %
Monocytes Absolute: 0.3 10*3/uL (ref 0.1–1.0)
NEUTROS ABS: 6.6 10*3/uL (ref 1.7–7.7)
NEUTROS PCT: 68 %
Platelets: 219 10*3/uL (ref 150–400)
RBC: 2.96 MIL/uL — AB (ref 3.87–5.11)
RDW: 14.3 % (ref 11.5–15.5)
WBC: 9.9 10*3/uL (ref 4.0–10.5)

## 2017-03-27 MED ORDER — MORPHINE SULFATE (PF) 4 MG/ML IV SOLN
4.0000 mg | Freq: Once | INTRAVENOUS | Status: AC
  Administered 2017-03-27: 4 mg via INTRAVENOUS
  Filled 2017-03-27: qty 1

## 2017-03-27 MED ORDER — LACTATED RINGERS IV BOLUS (SEPSIS)
1000.0000 mL | Freq: Once | INTRAVENOUS | Status: AC
  Administered 2017-03-27: 1000 mL via INTRAVENOUS

## 2017-03-27 MED ORDER — CEFTRIAXONE SODIUM 1 G IJ SOLR
1.0000 g | Freq: Once | INTRAMUSCULAR | Status: AC
  Administered 2017-03-27: 1 g via INTRAMUSCULAR
  Filled 2017-03-27: qty 10

## 2017-03-27 NOTE — MAU Note (Signed)
Pt stated she was given a Rx for a UTI a few days ago but had not been able to pick it up pain in in her LLQ and lower back

## 2017-03-27 NOTE — Discharge Instructions (Signed)
Need to Pick Up Prescription for antibiotics and take until completed. Return to MAU if symptoms worsen.

## 2017-03-27 NOTE — MAU Note (Signed)
Urine sent to lab 

## 2017-03-27 NOTE — MAU Provider Note (Signed)
History  Chief Complaint:  No chief complaint on file.  Anne Mcclain is a 21 y.o. [phone] female at [redacted]w[redacted]d presenting with feels like pressure and pain in her bottom. Rates the pain 10/10 and a shooting pain. She states that the pain is much worse with movement.   No pain with urination. She was previously diagnosed with UTI but did not pick up her prescription. She has been trying to drink more water. No pain with pooping, previous BM was yesterday. She denies fevers, chills. She endorses dizziness. She reports one syncopal event during pregnancy and she had two previously.   Reports active fetal movement, contractions: none, vaginal bleeding: none, membranes: intact. Denies uti s/s, abnormal/malodorous vag d/c or vulvovaginal itching/irritation.   [facility] care at [facility], first appointment with them is Sep 18. Pregnancy complicated by Late to prenatal care.   Obstetrical History: OB History    Gravida Para Term Preterm AB Living   4 1 1  0 2 1   SAB TAB Ectopic Multiple Live Births   1   1   1       Past Medical History: Past Medical History:  Diagnosis Date  . Medical history non-contributory     Past Surgical History: Past Surgical History:  Procedure Laterality Date  . thumb surgery      Social History: Social History   Social History  . Marital status: Single    Spouse name: N/A  . Number of children: N/A  . Years of education: N/A   Social History Main Topics  . Smoking status: Current Every Day Smoker    Packs/day: 0.25    Types: Cigarettes  . Smokeless tobacco: Never Used  . Alcohol use No  . Drug use: No  . Sexual activity: Yes   Other Topics Concern  . None   Social History Narrative  . None    Allergies: Allergies  Allergen Reactions  . Amoxicillin Anaphylaxis and Other (See Comments)    Has patient had a PCN reaction causing immediate rash, facial/tongue/throat swelling, SOB or lightheadedness with hypotension: Yes Has patient had a PCN reaction  causing severe rash involving mucus membranes or skin necrosis: No Has patient had a PCN reaction that required hospitalization No Has patient had a PCN reaction occurring within the last 10 years: No If all of the above answers are "NO", then may proceed with Cephalosporin use.  Marland Kitchen Penicillins Anaphylaxis and Other (See Comments)    Has patient had a PCN reaction causing immediate rash, facial/tongue/throat swelling, SOB or lightheadedness with hypotension: Yes Has patient had a PCN reaction causing severe rash involving mucus membranes or skin necrosis: No Has patient had a PCN reaction that required hospitalization No Has patient had a PCN reaction occurring within the last 10 years: No If all of the above answers are "NO", then may proceed with Cephalosporin use. Pt reports receiving rocephin in the past with no reactions    Prescriptions Prior to Admission  Medication Sig Dispense Refill Last Dose  . diphenhydramine-acetaminophen (TYLENOL PM) 25-500 MG TABS tablet Take 1 tablet by mouth at bedtime as needed (pain sleep).   Past Month at Unknown time  . Prenatal Vit-Fe Fumarate-FA (PRENATAL MULTIVITAMIN) TABS tablet Take 1 tablet by mouth daily at 12 noon.   03/27/2017 at Unknown time  . iron polysaccharides (NIFEREX) 150 MG capsule Take 1 capsule (150 mg total) by mouth daily. (Patient not taking: Reported on 03/27/2017) 60 capsule 2 Not Taking at Unknown time  . nitrofurantoin, macrocrystal-monohydrate, (  MACROBID) 100 MG capsule Take 1 capsule (100 mg total) by mouth 2 (two) times daily. (Patient not taking: Reported on 03/27/2017) 14 capsule 0 Not Taking at Unknown time    Review of Systems  Pertinent pos/neg as indicated in HPI  Physical Exam  Blood pressure (!) 96/48, pulse 86, temperature 98.1 F (36.7 C), resp. rate 20, last menstrual period 11/02/2016. General appearance: alert, cooperative and appears uncomfortable due to pain Lungs: normal work of breathing Heart: regular rate  and rhythm Abdomen: gravid, soft, tender to palpation in RLQ, no rebound or guarding, neg murphy's, L CVA tenderness present Extremities: no edema  Dilation: Closed Exam by:: leftwich-kirby cnm   MAU Course  MDM Vitals review Physical exam CBC/ UA MAU observation Patient verbalized agreement and understanding with the assessment and plan.  Labs:  Results for orders placed or performed during the hospital encounter of 03/27/17 (from the past 24 hour(s))  Urinalysis, Routine w reflex microscopic     Status: Abnormal   Collection Time: 03/27/17  2:45 PM  Result Value Ref Range   Color, Urine YELLOW YELLOW   APPearance HAZY (A) CLEAR   Specific Gravity, Urine 1.013 1.005 - 1.030   pH 8.0 5.0 - 8.0   Glucose, UA NEGATIVE NEGATIVE mg/dL   Hgb urine dipstick SMALL (A) NEGATIVE   Bilirubin Urine NEGATIVE NEGATIVE   Ketones, ur NEGATIVE NEGATIVE mg/dL   Protein, ur NEGATIVE NEGATIVE mg/dL   Nitrite NEGATIVE NEGATIVE   Leukocytes, UA LARGE (A) NEGATIVE   RBC / HPF 6-30 0 - 5 RBC/hpf   WBC, UA TOO NUMEROUS TO COUNT 0 - 5 WBC/hpf   Bacteria, UA RARE (A) NONE SEEN   Squamous Epithelial / LPF 6-30 (A) NONE SEEN   Mucus PRESENT    Amorphous Crystal PRESENT   CBC with Differential/Platelet     Status: Abnormal   Collection Time: 03/27/17  4:14 PM  Result Value Ref Range   WBC 9.9 4.0 - 10.5 K/uL   RBC 2.96 (L) 3.87 - 5.11 MIL/uL   Hemoglobin 9.2 (L) 12.0 - 15.0 g/dL   HCT 16.1 (L) 09.6 - 04.5 %   MCV 90.5 78.0 - 100.0 fL   MCH 31.1 26.0 - 34.0 pg   MCHC 34.3 30.0 - 36.0 g/dL   RDW 40.9 81.1 - 91.4 %   Platelets 219 150 - 400 K/uL   Neutrophils Relative % 68 %   Neutro Abs 6.6 1.7 - 7.7 K/uL   Lymphocytes Relative 28 %   Lymphs Abs 2.8 0.7 - 4.0 K/uL   Monocytes Relative 3 %   Monocytes Absolute 0.3 0.1 - 1.0 K/uL   Eosinophils Relative 1 %   Eosinophils Absolute 0.1 0.0 - 0.7 K/uL   Basophils Relative 0 %   Basophils Absolute 0.0 0.0 - 0.1 K/uL    Imaging:   Renal  Ultrasound: IMPRESSION: 1. Mild right-sided hydronephrosis without obstructing stone or mass lesion. This may be physiologic, related to the pregnancy. 2. Otherwise normal bilateral renal ultrasound.  Assessment and Plan  A: Jinna Weinman is a 21yoF [id number] at [redacted]w[redacted]d who presents with low back pain and pressure. Given hx of UTI and UA indicating UTI + CVA tenderness, complicated UTI is most likely diagnoses. No evidence of renal stone seen on [place]. Appendicitis or other intraabdominal etiology unlikely due to no rebound or peritoneal signs, lack of nausea, no fever and normal WBC count.   P: Complicated UTI: Patient stable and deemed appropriate for discharge home. 1G  IM Rocephin given in MAU. Prescribed oral rocephin for home management. IV morphine given for pain relief. IV fluids given for hydration. Strict return precautions given regarding new or worsening symptoms.   Anemia: Pt advised to continue taking prenatal vitamin and follow up at subsequent appointment. Return precautions given regarding symptomatic anemia.   Patient instructed to keep prenatal appointment on Sep 18 at [facility].  Dannette Barbara, Medical Student 8/24/20187:09 PM  I confirm that I have verified the information documented in the student's note and that I have also personally performed the physical exam and all medical decision making activities. Consulted Dr Shawnie Pons at the time of the visit with presentation and findings. Pt well appearing at time of discharge and tolerated Rocephin IM well. She is to pick up macrobid as prescribed previously.  Sharen Counter, CNM 8:48 PM

## 2017-04-12 ENCOUNTER — Emergency Department (HOSPITAL_COMMUNITY)
Admission: EM | Admit: 2017-04-12 | Discharge: 2017-04-12 | Disposition: A | Discharge status: Home / Self Care | Payer: Medicaid Other | Attending: Emergency Medicine | Admitting: Emergency Medicine

## 2017-04-12 ENCOUNTER — Encounter (HOSPITAL_COMMUNITY): Payer: Self-pay | Admitting: Emergency Medicine

## 2017-04-12 DIAGNOSIS — M5489 Other dorsalgia: Secondary | ICD-10-CM | POA: Insufficient documentation

## 2017-04-12 DIAGNOSIS — O219 Vomiting of pregnancy, unspecified: Secondary | ICD-10-CM | POA: Insufficient documentation

## 2017-04-12 DIAGNOSIS — O2692 Pregnancy related conditions, unspecified, second trimester: Secondary | ICD-10-CM | POA: Diagnosis present

## 2017-04-12 DIAGNOSIS — Z3A23 23 weeks gestation of pregnancy: Secondary | ICD-10-CM | POA: Insufficient documentation

## 2017-04-12 DIAGNOSIS — R112 Nausea with vomiting, unspecified: Secondary | ICD-10-CM

## 2017-04-12 DIAGNOSIS — F17210 Nicotine dependence, cigarettes, uncomplicated: Secondary | ICD-10-CM | POA: Insufficient documentation

## 2017-04-12 LAB — URINALYSIS, ROUTINE W REFLEX MICROSCOPIC
Bacteria, UA: NONE SEEN
Bilirubin Urine: NEGATIVE
GLUCOSE, UA: NEGATIVE mg/dL
Hgb urine dipstick: NEGATIVE
Ketones, ur: NEGATIVE mg/dL
NITRITE: NEGATIVE
PH: 6 (ref 5.0–8.0)
Protein, ur: NEGATIVE mg/dL
Specific Gravity, Urine: 1.021 (ref 1.005–1.030)

## 2017-04-12 LAB — CBC WITH DIFFERENTIAL/PLATELET
Basophils Absolute: 0 10*3/uL (ref 0.0–0.1)
Basophils Relative: 0 %
Eosinophils Absolute: 0.3 10*3/uL (ref 0.0–0.7)
Eosinophils Relative: 3 %
HEMATOCRIT: 26.5 % — AB (ref 36.0–46.0)
Hemoglobin: 8.5 g/dL — ABNORMAL LOW (ref 12.0–15.0)
LYMPHS ABS: 3.2 10*3/uL (ref 0.7–4.0)
LYMPHS PCT: 29 %
MCH: 29 pg (ref 26.0–34.0)
MCHC: 32.1 g/dL (ref 30.0–36.0)
MCV: 90.4 fL (ref 78.0–100.0)
MONO ABS: 0.8 10*3/uL (ref 0.1–1.0)
MONOS PCT: 7 %
NEUTROS ABS: 7 10*3/uL (ref 1.7–7.7)
Neutrophils Relative %: 61 %
Platelets: 224 10*3/uL (ref 150–400)
RBC: 2.93 MIL/uL — ABNORMAL LOW (ref 3.87–5.11)
RDW: 14.3 % (ref 11.5–15.5)
WBC: 11.3 10*3/uL — ABNORMAL HIGH (ref 4.0–10.5)

## 2017-04-12 LAB — BASIC METABOLIC PANEL
Anion gap: 7 (ref 5–15)
BUN: 5 mg/dL — ABNORMAL LOW (ref 6–20)
CHLORIDE: 109 mmol/L (ref 101–111)
CO2: 21 mmol/L — ABNORMAL LOW (ref 22–32)
Calcium: 8.3 mg/dL — ABNORMAL LOW (ref 8.9–10.3)
Creatinine, Ser: 0.64 mg/dL (ref 0.44–1.00)
GLUCOSE: 81 mg/dL (ref 65–99)
POTASSIUM: 3.9 mmol/L (ref 3.5–5.1)
SODIUM: 137 mmol/L (ref 135–145)

## 2017-04-12 MED ORDER — ONDANSETRON HCL 4 MG/2ML IJ SOLN
4.0000 mg | Freq: Once | INTRAMUSCULAR | Status: AC
  Administered 2017-04-12: 4 mg via INTRAVENOUS
  Filled 2017-04-12: qty 2

## 2017-04-12 MED ORDER — DOXYLAMINE-PYRIDOXINE 10-10 MG PO TBEC: 1.0000 | DELAYED_RELEASE_TABLET | Freq: Four times a day (QID) | ORAL | 0 refills | Status: AC | PRN

## 2017-04-12 MED ORDER — SODIUM CHLORIDE 0.9 % IV BOLUS (SEPSIS)
1000.0000 mL | Freq: Once | INTRAVENOUS | Status: AC
  Administered 2017-04-12: 1000 mL via INTRAVENOUS

## 2017-04-12 NOTE — ED Triage Notes (Signed)
Pt c/o increase nausea and vomiting with 6/10 lower back pain for the past 3 hours, she is [redacted] weeks pregnant denies any vaginal discharge or bleeding, no fever or chills.

## 2017-04-12 NOTE — ED Notes (Signed)
Fetal Heart tone measured at triage 150.

## 2017-04-12 NOTE — ED Notes (Signed)
Rapid Respond OB notified that pt will be on B17

## 2017-04-12 NOTE — ED Notes (Signed)
Ob rapid response at bedside  She has dopplered the  fhr  Pt is 23 weeks

## 2017-04-12 NOTE — ED Provider Notes (Signed)
[facility] DEPT Provider Note   CSN: [id number] Arrival date & time: 04/12/17  0116     History   Chief Complaint Chief Complaint  Patient presents with  . Back Pain  . Emesis    HPI Anne Mcclain is a 21 y.o. female.  HPI Patient is a 21 year old female who is approximately [redacted] weeks pregnant.  She's not had any significant prenatal care to this point.  She presents the emergency department with mid to low back pain over the past several hours as well as nausea vomiting.  She was recently treated for a urinary tract infection and reports completion of antibiotics.  No dysuria or urinary frequency.  No fevers or chills.  Denies pain.  Denies pain consistent with contractions the patient.  She has a child before and went through normal childbirth.  Rapid response OB at the bedside   Past Medical History:  Diagnosis Date  . Medical history non-contributory     Patient Active Problem List   Diagnosis Date Noted  . UTI (urinary tract infection) during pregnancy 03/25/2017  . Anemia in pregnancy 03/23/2017  . Late prenatal care 03/23/2017    Past Surgical History:  Procedure Laterality Date  . thumb surgery      OB History    Gravida Para Term Preterm AB Living   4 1 1  0 2 1   SAB TAB Ectopic Multiple Live Births   1   1   1        Home Medications    Prior to Admission medications   Medication Sig Start Date End Date Taking? Authorizing Provider  Prenatal Vit-Fe Fumarate-FA (PRENATAL MULTIVITAMIN) TABS tablet Take 1 tablet by mouth daily at 12 noon.   Yes [provider]  Doxylamine-Pyridoxine (DICLEGIS) 10-10 MG TBEC Take 1 tablet by mouth every 6 (six) hours as needed (nausea). 04/12/17   Azalia Bilisampos, Karalina Tift, MD    Family History Family History  Problem Relation Age of Onset  . Miscarriages / [place] Mother   . Miscarriages / Stillbirths Maternal Grandmother     Social History Social History  Substance Use Topics  . Smoking status: Current  Every Day Smoker    Packs/day: 0.25    Types: Cigarettes  . Smokeless tobacco: Never Used  . Alcohol use No     Allergies   Amoxicillin and Penicillins   Review of Systems Review of Systems  All other systems reviewed and are negative.    Physical Exam Updated Vital Signs BP 102/74   Pulse 72   Temp 98.7 F (37.1 C) (Oral)   Resp 16   Ht 5\' 4"  (1.626 m)   Wt 60.8 kg (134 lb)   LMP 11/02/2016   SpO2 99%   BMI 23.00 kg/m   Physical Exam  Constitutional: She is oriented to person, place, and time. She appears well-developed and well-nourished.  HENT:  Head: Normocephalic.  Eyes: EOM are normal.  Neck: Normal range of motion.  Pulmonary/Chest: Effort normal.  Abdominal: She exhibits no distension.  Gravid uterus consistent with dates.  No tenderness  Musculoskeletal: Normal range of motion.  Neurological: She is alert and oriented to person, place, and time.  Psychiatric: She has a normal mood and affect.  Nursing note and vitals reviewed.    ED Treatments / Results  Labs (all labs ordered are listed, but only abnormal results are displayed) Labs Reviewed  URINALYSIS, ROUTINE W REFLEX MICROSCOPIC - Abnormal; Notable for the following:       Result  Value   Leukocytes, UA TRACE (*)    Squamous Epithelial / LPF 0-5 (*)    All other components within normal limits  CBC WITH DIFFERENTIAL/PLATELET - Abnormal; Notable for the following:    WBC 11.3 (*)    RBC 2.93 (*)    Hemoglobin 8.5 (*)    HCT 26.5 (*)    All other components within normal limits  BASIC METABOLIC PANEL - Abnormal; Notable for the following:    CO2 21 (*)    BUN 5 (*)    Calcium 8.3 (*)    All other components within normal limits    EKG  EKG Interpretation None       Radiology No results found.  Procedures Procedures (including critical care time)  Medications Ordered in ED Medications  sodium chloride 0.9 % bolus 1,000 mL (0 mLs Intravenous Stopped 04/12/17 0630)    ondansetron (ZOFRAN) injection 4 mg (4 mg Intravenous Given 04/12/17 0542)     Initial Impression / Assessment and Plan / ED Course  I have reviewed the triage vital signs and the nursing notes.  Pertinent labs & imaging results that were available during my care of the patient were reviewed by me and considered in my medical decision making (see chart for details).     Feels better after IV fluids and nausea medicine.  No vomiting emergency department.  Fetal heart rates are 150.  Good fetal movement.  No indication for tocometry.  Patient given referral to women's clinic.  She understands to return to the ER for new or worsening symptoms.  Final Clinical Impressions(s) / ED Diagnoses   Final diagnoses:  Nausea and vomiting, intractability of vomiting not specified, unspecified vomiting type    New Prescriptions Discharge Medication List as of 04/12/2017  7:38 AM    START taking these medications   Details  Doxylamine-Pyridoxine (DICLEGIS) 10-10 MG TBEC Take 1 tablet by mouth every 6 (six) hours as needed (nausea)., Starting Sun 04/12/2017, Print         Azalia Bilis, MD 04/12/17 [phone]

## 2017-04-12 NOTE — Progress Notes (Signed)
[facility], patient [redacted] weeks gestation in with nausea/vomiting and back pain. Triage nurse to doppler heart tones and to call [facility] when patient has a room if ED provider still wants patient seen.

## 2017-04-12 NOTE — ED Notes (Signed)
OB rapid respond [facility]. OB RN to be called back when pt is on a room.

## 2017-04-12 NOTE — Progress Notes (Signed)
Seen patient G4P1, 23weeks 6days per ultrasound in July at [facility].  Patient denis contractions, LOF or vaginal bleeding. C/O nausea and vomiting and lower back pain. Patient recently treated for UTI and states just completed antbx.  Patient reports urine continues to be dark.  Positive fetal movement on palpation, no contrations noted on palpation. Bedside u/s done per ED provider.  Edsel Petrin. Arnold updated on patient and patient OB cleared. Plan for patient to get IVF's and IV med for n/v.

## 2017-04-12 NOTE — ED Notes (Signed)
Pt still sl nauseated but feels better

## 2017-04-16 ENCOUNTER — Encounter: Payer: Self-pay | Admitting: Advanced Practice Midwife

## 2017-04-16 ENCOUNTER — Ambulatory Visit (INDEPENDENT_AMBULATORY_CARE_PROVIDER_SITE_OTHER): Payer: Medicaid Other | Admitting: Advanced Practice Midwife

## 2017-04-16 VITALS — BP 127/59 | HR 79 | Wt 144.5 lb

## 2017-04-16 DIAGNOSIS — Z8719 Personal history of other diseases of the digestive system: Secondary | ICD-10-CM

## 2017-04-16 DIAGNOSIS — Z3480 Encounter for supervision of other normal pregnancy, unspecified trimester: Secondary | ICD-10-CM

## 2017-04-16 DIAGNOSIS — Z87738 Personal history of other specified (corrected) congenital malformations of digestive system: Secondary | ICD-10-CM | POA: Insufficient documentation

## 2017-04-16 DIAGNOSIS — Z3482 Encounter for supervision of other normal pregnancy, second trimester: Secondary | ICD-10-CM | POA: Diagnosis not present

## 2017-04-16 LAB — POCT URINALYSIS DIP (DEVICE)
BILIRUBIN URINE: NEGATIVE
GLUCOSE, UA: NEGATIVE mg/dL
Hgb urine dipstick: NEGATIVE
KETONES UR: 15 mg/dL — AB
Nitrite: NEGATIVE
PROTEIN: NEGATIVE mg/dL
SPECIFIC GRAVITY, URINE: 1.02 (ref 1.005–1.030)
Urobilinogen, UA: 0.2 mg/dL (ref 0.0–1.0)
pH: 6.5 (ref 5.0–8.0)

## 2017-04-16 MED ORDER — FERROUS SULFATE 325 (65 FE) MG PO TABS: 325.0000 mg | ORAL_TABLET | Freq: Two times a day (BID) | ORAL | 1 refills | Status: AC

## 2017-04-16 NOTE — Progress Notes (Signed)
  Subjective:    Anne Mcclain is a [id number] 6951w5d being seen today for her first obstetrical visit.  Her obstetrical history is significant for Late to care, UTI in pregnancy, FOB with Hirschprungs.. Patient does intend to breast feed. Pregnancy history fully reviewed.  Patient reports no complaints.  Vitals:   04/16/17 1302  BP: (!) 127/59  Pulse: 79  Weight: 144 lb 8 oz (65.5 kg)    HISTORY: OB History  Gravida Para Term Preterm AB Living  4 1 1  0 2 1  SAB TAB Ectopic Multiple Live Births  1   1   1     # Outcome Date GA Lbr Len/2nd Weight Sex Delivery Anes PTL Lv  4 Current           3 SAB 09/28/16          2 Term 12/03/13 3714w0d   F Vag-Spont   LIV  1 Ectopic 10/16/13             Past Medical History:  Diagnosis Date  . Medical history non-contributory    Past Surgical History:  Procedure Laterality Date  . thumb surgery     Family History  Problem Relation Age of Onset  . Miscarriages / [place] Mother   . Miscarriages / Stillbirths Maternal Grandmother      Exam    Uterus:     Pelvic Exam:    Perineum: Deferred due to recent exam   Vulva: Deferred    Vagina:  deferred   pH:    Cervix: deferred   Adnexa: not evaluated   Bony Pelvis: gynecoid  System: Breast:  normal appearance, no masses or tenderness   Skin: normal coloration and turgor, no rashes    Neurologic: oriented, grossly non-focal   Extremities: normal strength, tone, and muscle mass   HEENT normal   Mouth/Teeth mucous membranes moist, pharynx normal without lesions   Neck supple   Cardiovascular: regular rate and rhythm   Respiratory:  appears well, vitals normal, no respiratory distress, acyanotic, normal RR, ear and throat exam is normal, neck free of mass or lymphadenopathy, chest clear, no wheezing, crepitations, rhonchi, normal symmetric air entry   Abdomen: soft, non-tender; bowel sounds normal; no masses,  no organomegaly   Urinary: deferred      Assessment:    Pregnancy: [id number] Patient Active Problem List   Diagnosis Date Noted  . Supervision of other normal pregnancy, antepartum 04/16/2017  . 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 UTI (urinary tract infection) during pregnancy 03/25/2017  . Anemia in pregnancy 03/23/2017  . Late prenatal care 03/23/2017        Plan:     Initial labs drawn. Prenatal vitamins. Problem list reviewed and updated. Genetic Screening discussed Quad Screen: too late.  Ultrasound discussed; fetal survey: ordered.  Follow up in 2 weeks. 50% of 30 min visit spent on counseling and coordination of care.   Welcomed to practice Routines reviewed Anemia:   Rx Ferrous sulfate bid May want Lotus birth, discussed issues, they will think about it Declines waterbirth, wants epidural FOB has Hirschprungs.  Message sent to Dr Sherrie Georgeecker to see if she recommends formal genetic counseling    Anne Mcclain 04/16/2017

## 2017-04-16 NOTE — Progress Notes (Signed)
Patient is here today for her first Initial OB visit 5345w4d.

## 2017-04-16 NOTE — Patient Instructions (Signed)

## 2017-04-17 LAB — OBSTETRIC PANEL, INCLUDING HIV
Antibody Screen: NEGATIVE
BASOS: 0 %
Basophils Absolute: 0 10*3/uL (ref 0.0–0.2)
EOS (ABSOLUTE): 0.1 10*3/uL (ref 0.0–0.4)
EOS: 1 %
HEMATOCRIT: 30.1 % — AB (ref 34.0–46.6)
HEMOGLOBIN: 9.5 g/dL — AB (ref 11.1–15.9)
HIV SCREEN 4TH GENERATION: NONREACTIVE
Hepatitis B Surface Ag: NEGATIVE
Immature Grans (Abs): 0 10*3/uL (ref 0.0–0.1)
Immature Granulocytes: 0 %
LYMPHS ABS: 2.1 10*3/uL (ref 0.7–3.1)
Lymphs: 21 %
MCH: 28.5 pg (ref 26.6–33.0)
MCHC: 31.6 g/dL (ref 31.5–35.7)
MCV: 90 fL (ref 79–97)
MONOCYTES: 5 %
Monocytes Absolute: 0.5 10*3/uL (ref 0.1–0.9)
NEUTROS ABS: 7.5 10*3/uL — AB (ref 1.4–7.0)
Neutrophils: 73 %
PLATELETS: 236 10*3/uL (ref 150–379)
RBC: 3.33 x10E6/uL — ABNORMAL LOW (ref 3.77–5.28)
RDW: 14.5 % (ref 12.3–15.4)
RH TYPE: POSITIVE
RPR: NONREACTIVE
RUBELLA: 12.1 {index} (ref >0.99)
WBC: 10.4 10*3/uL (ref 3.4–10.8)

## 2017-04-17 LAB — HEMOGLOBINOPATHY EVALUATION
HEMOGLOBIN A2 QUANTITATION: 1.2 % — AB (ref 1.8–3.2)
HGB A: 98.8 % (ref 96.4–98.8)
HGB C: 0 %
HGB S: 0 %
HGB VARIANT: 0 %
Hemoglobin F Quantitation: 0 % (ref 0.0–2.0)

## 2017-04-18 LAB — CULTURE, OB URINE

## 2017-04-18 LAB — URINE CULTURE, OB REFLEX

## 2017-04-24 ENCOUNTER — Ambulatory Visit (HOSPITAL_COMMUNITY): Admission: RE | Admit: 2017-04-24 | Payer: Medicaid Other | Source: Ambulatory Visit

## 2017-05-16 ENCOUNTER — Encounter: Payer: Self-pay | Admitting: Obstetrics and Gynecology

## 2017-08-06 ENCOUNTER — Telehealth (HOSPITAL_COMMUNITY): Payer: Self-pay

## 2017-08-06 NOTE — Telephone Encounter (Signed)
Release of information sent to [facility]. Prenatals printed and faxed.

## 2017-10-24 ENCOUNTER — Encounter (HOSPITAL_COMMUNITY): Payer: Self-pay

## 2018-05-17 IMAGING — US US MFM OB LIMITED
1 series · 14 of 28 positions shown · non-contrast
Comparison: none

[Series 1: us mfm ob limited · 14 of 38 slices shown]
[im 2/38]
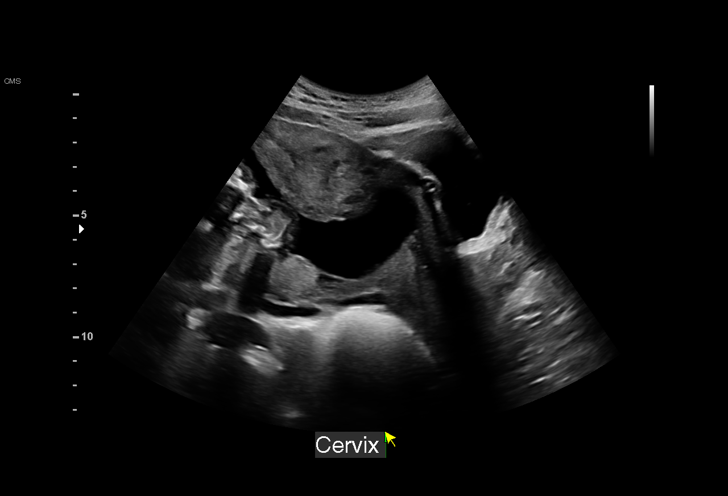
[im 5/38]
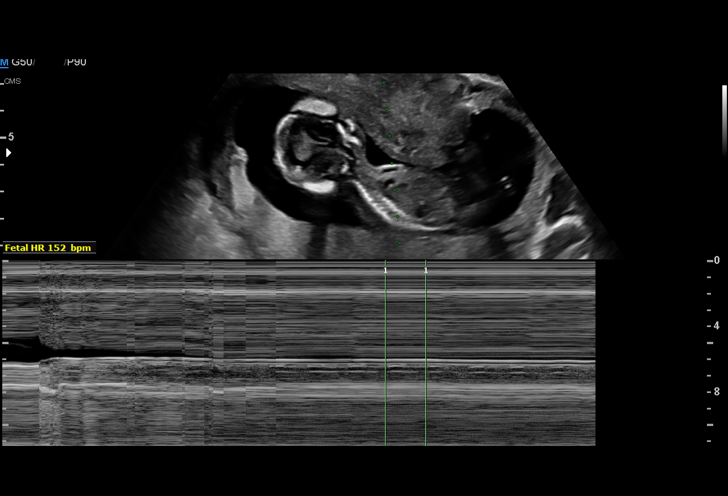
[im 7/38]
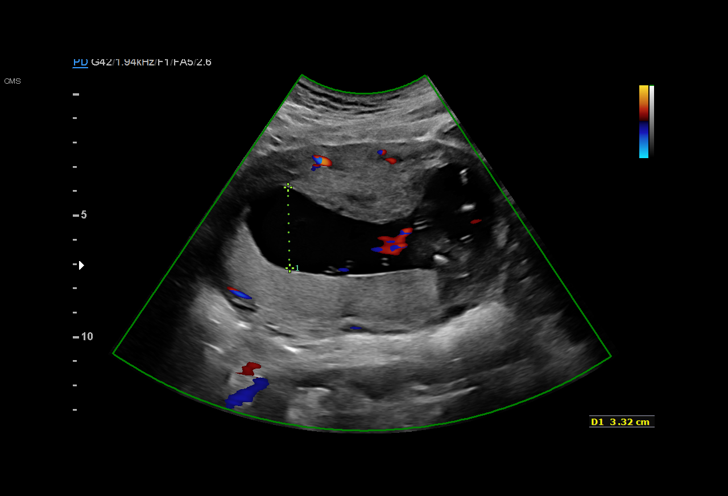
[im 10/38]
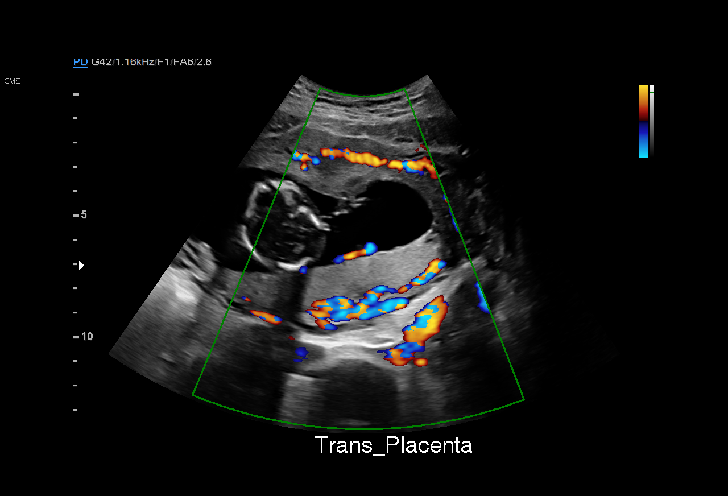
[im 13/38]
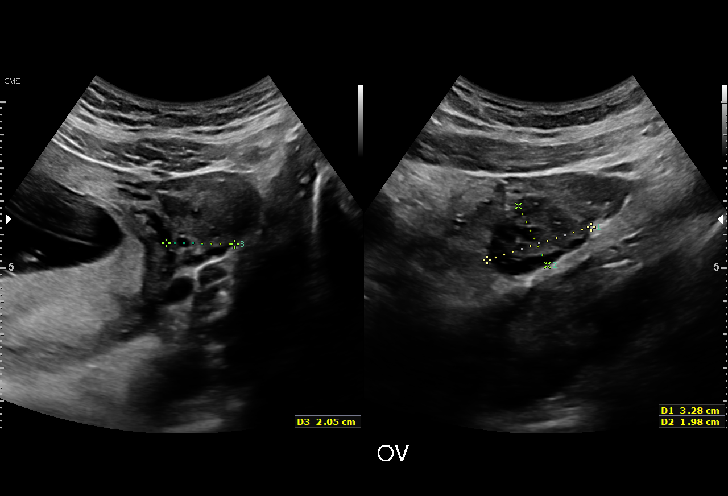
[im 16/38]
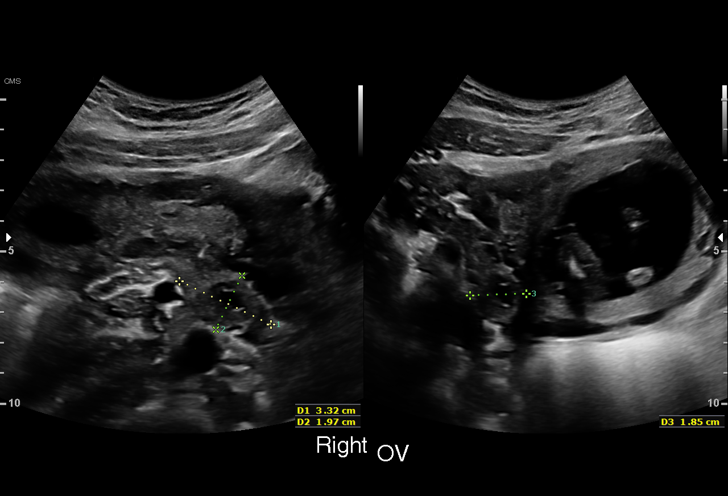
[im 18/38]
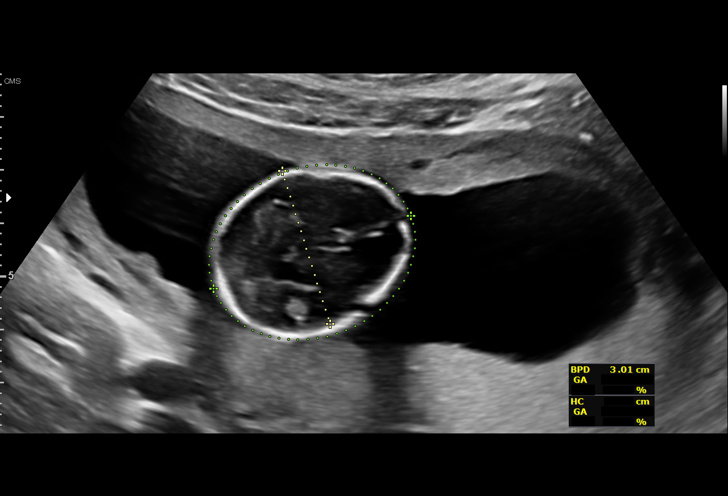
[im 21/38]
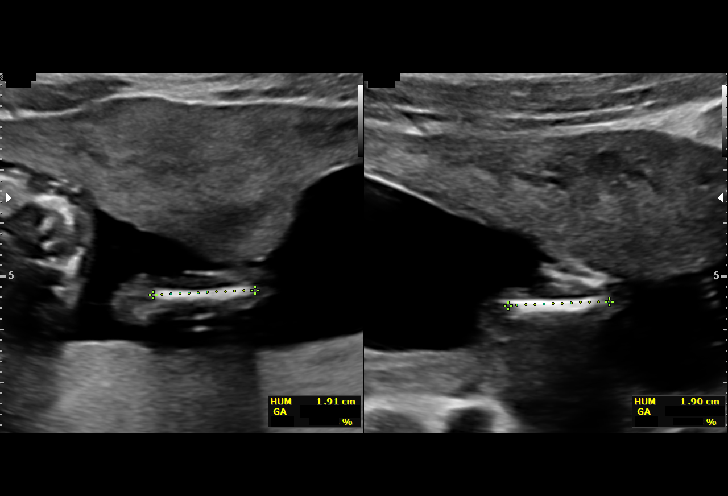
[im 24/38]
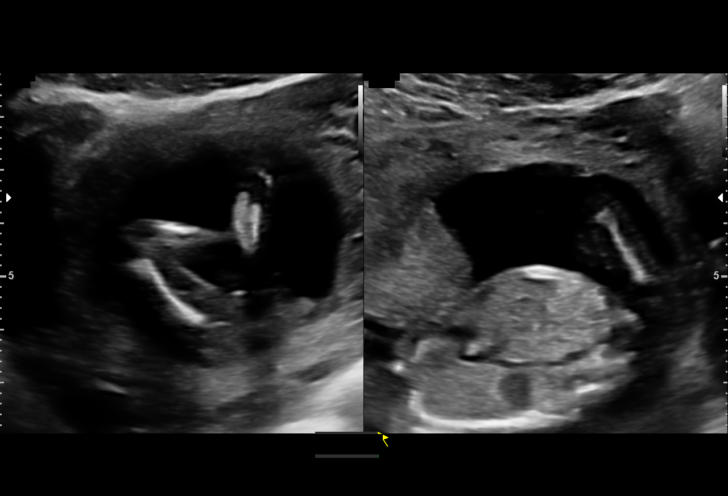
[im 27/38]
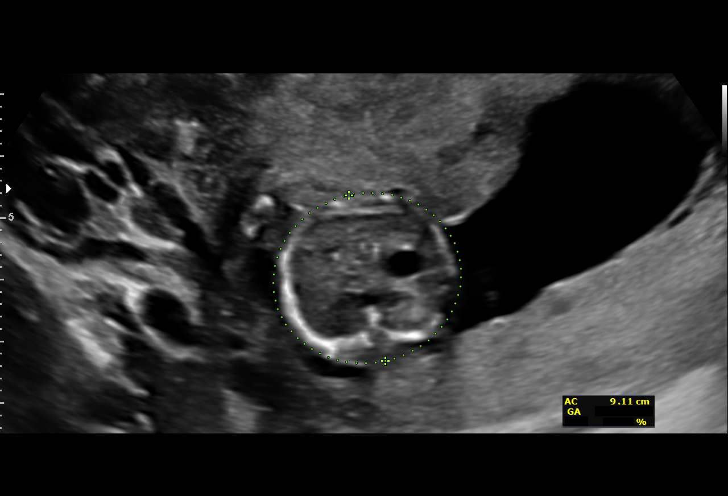
[im 29/38]
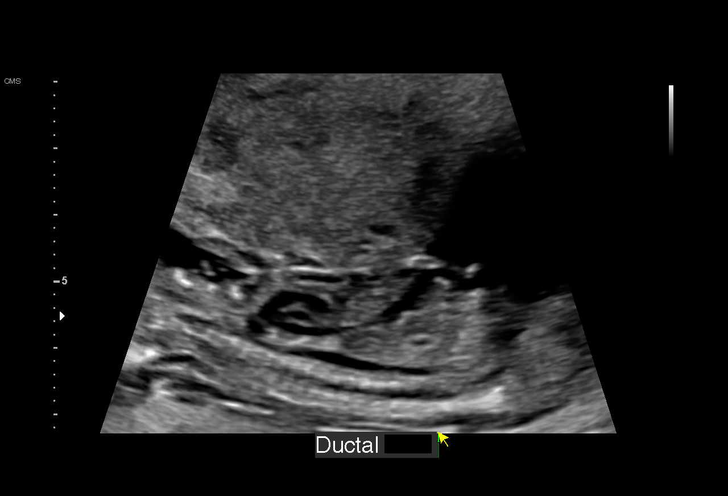
[im 32/38]
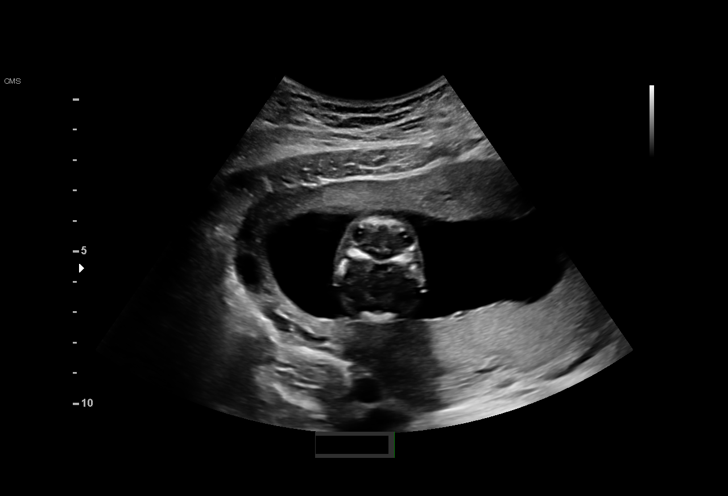
[im 35/38]
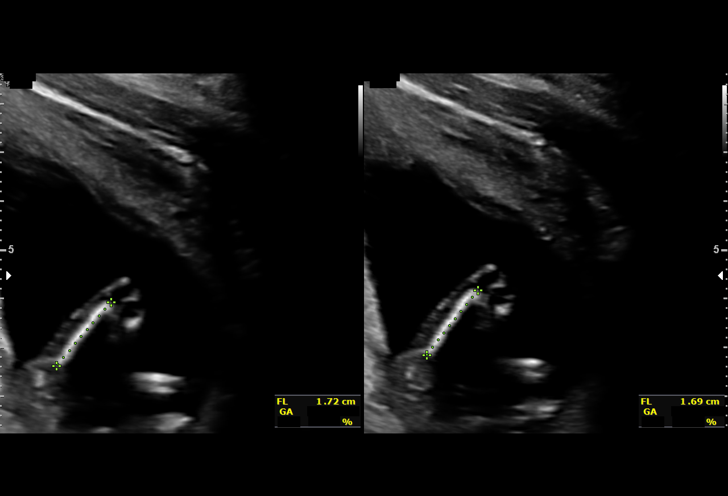
[im 38/38]
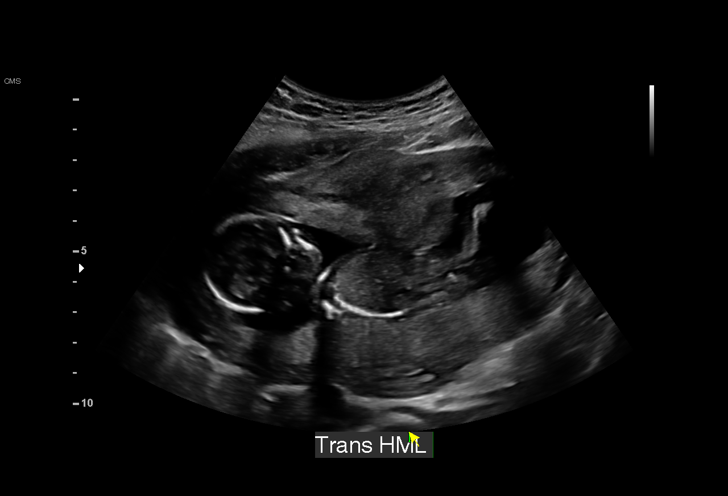

[14 of 28 positions shown; findings below may reference images not displayed]

1  MIHAELA MILENKO GIZDIC           [id number]      [id number]     [id number]
Indications

15 weeks gestation of pregnancy
Encounter for uncertain dates
Abdominal pain in pregnancy
OB History

Gravidity:    3         Term:   1        Prem:   0        SAB:   0
TOP:          0       Ectopic:  1        Living: 1
Fetal Evaluation

Num Of Fetuses:     1
Fetal Heart         152
Rate(bpm):
Cardiac Activity:   Observed
Presentation:       Variable
Placenta:           Posterior
P. Cord Insertion:  Visualized

Amniotic Fluid
AFI FV:      Subjectively within normal limits

Largest Pocket(cm)
3.3
Biometry

BPD:      30.1  mm     G. Age:  15w 4d         55  %    CI:        72.21   %   70 - 86
FL/HC:      15.2   %   15.3 -
HC:      112.7  mm     G. Age:  15w 3d         43  %    HC/AC:      1.24       1.05 -
AC:       91.1  mm     G. Age:  15w 2d         54  %    FL/BPD:     56.8   %
FL:       17.1  mm     G. Age:  15w 0d         36  %    FL/AC:      18.8   %   20 - 24
HUM:      19.1  mm     G. Age:  15w 4d         64  %

Est. FW:     118  gm      0 lb 4 oz     73  %
Gestational Age

LMP:           14w 1d       Date:   11/02/16                 EDD:   08/09/17
U/S Today:     15w 2d                                        EDD:   08/01/17
Best:          15w 2d    Det. By:   U/S (02/09/17)           EDD:   08/01/17
Anatomy

Cranium:               Appears normal         Stomach:                Appears normal, left
sided
Choroid Plexus:        Appears normal         Abdomen:                Appears normal
Nuchal Fold:           Not applicable (< 16   Cord Vessels:           Appears normal (3
wks GA)                                        vessel cord)
Face:                  Appears normal         Kidneys:                Appear normal
(orbits and profile)
Thoracic:              Appears normal         Bladder:                Appears normal
LVOT:                  Appears normal         Upper Extremities:      Appears normal
Aortic Arch:           Appears normal         Lower Extremities:      Appears normal
Ductal Arch:           Appears normal

Other:  Heels visualized. Technically difficult due to early gestational age.
Cervix Uterus Adnexa

Cervix
Length:            4.6  cm.
Normal appearance by transabdominal scan.

Uterus
No abnormality visualized.

Left Ovary
Within normal limits.

Right Ovary
Within normal limits.

Cul De Sac:   No free fluid seen.

Adnexa:       No abnormality visualized.
Impression

SIUP at 15+2 weeks
No gross abnormalities identified
Normal amniotic fluid volume
EDC based on today's measurements: 08/01/17
Posterior placenta; no SCH identified
Recommendations

Follow-up ultrasound in 3-4 weeks to complete anatomy
survey

## 2018-07-02 IMAGING — US US RENAL
1 series · 15 of 25 positions shown · non-contrast
Comparison: None.

CLINICAL DATA: Acute left-sided flank pain. Second trimester
pregnancy.

EXAM:
RENAL / URINARY TRACT ULTRASOUND COMPLETE

[Series 1: us renal · 15 of 28 slices shown]
[im 1/28]
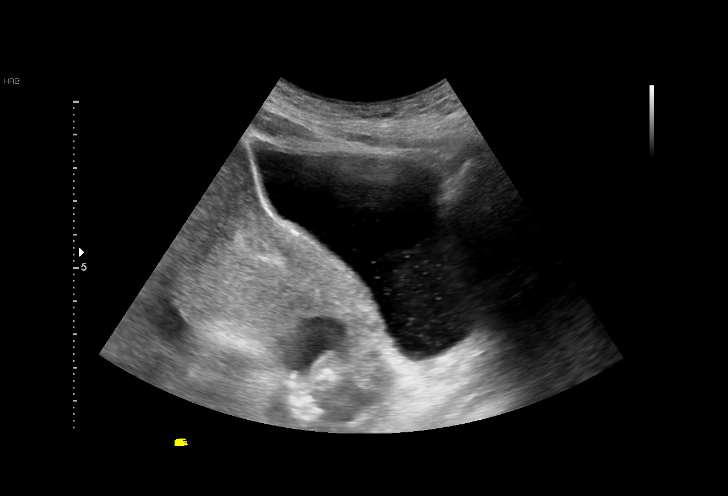
[im 3/28]
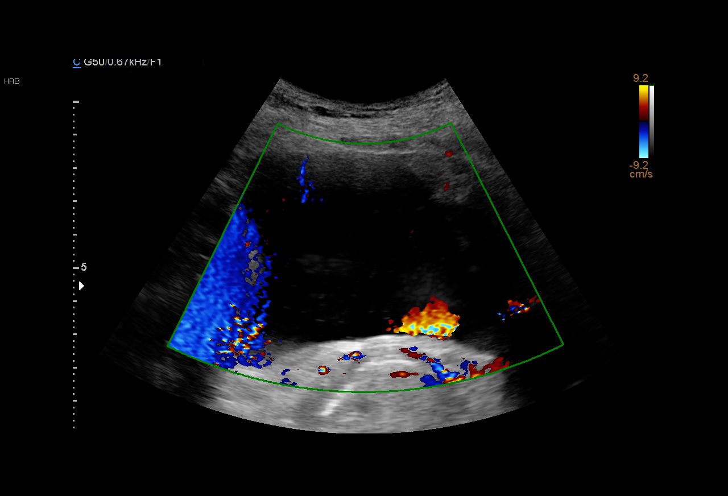
[im 5/28]
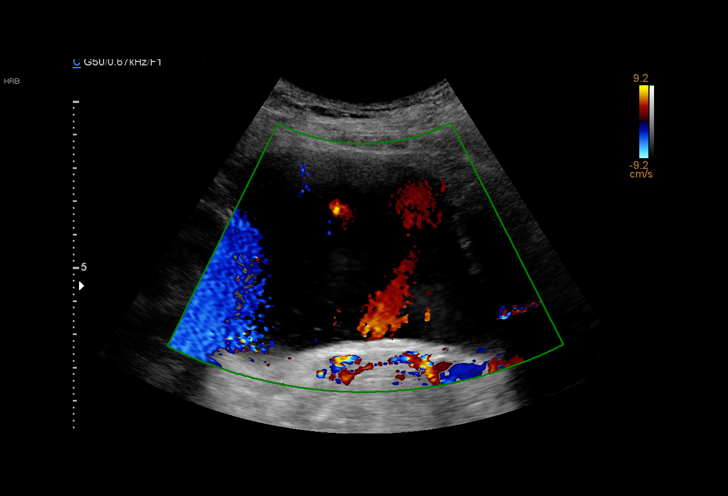
[im 6/28]
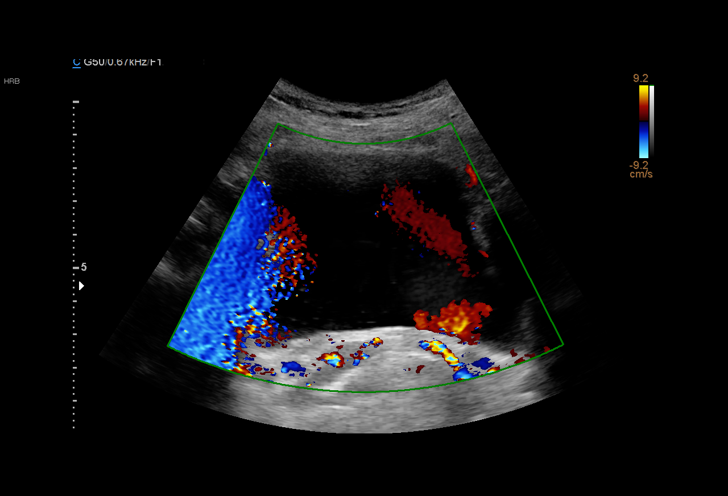
[im 8/28]
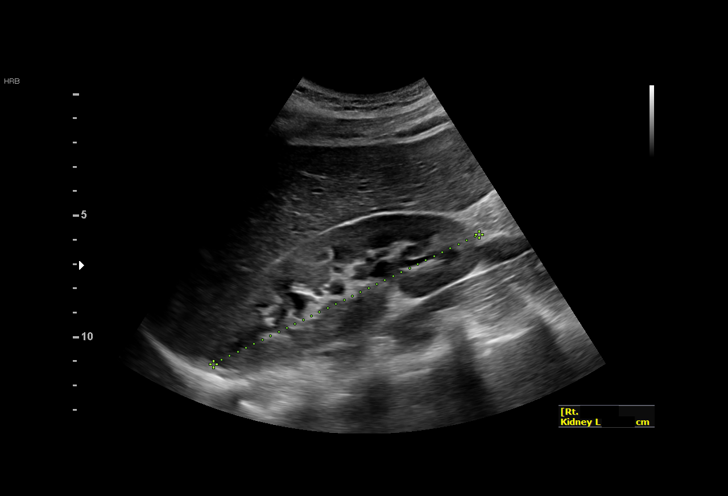
[im 11/28]
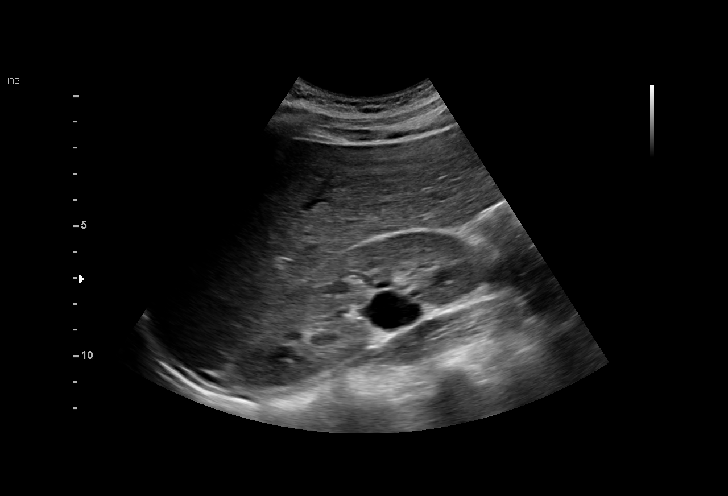
[im 12/28]
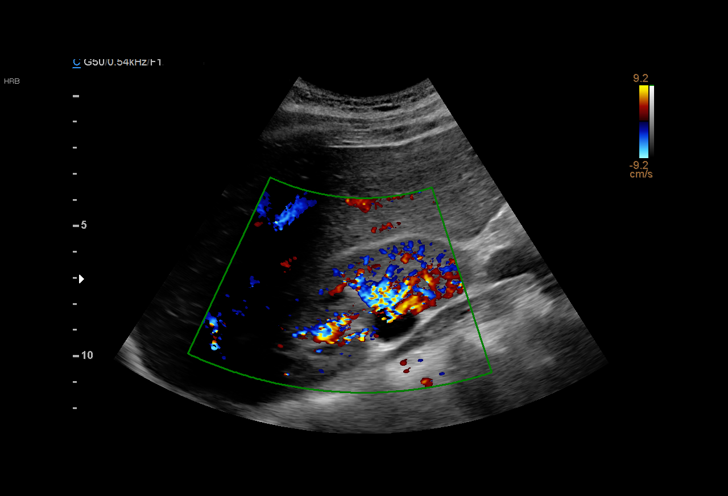
[im 14/28]
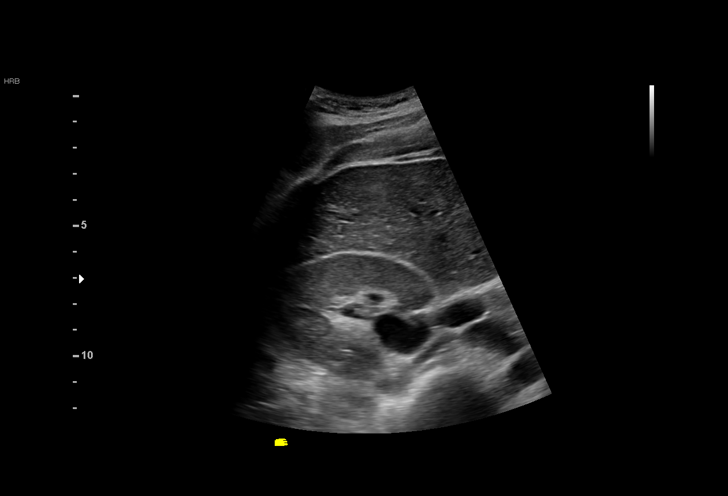
[im 16/28]
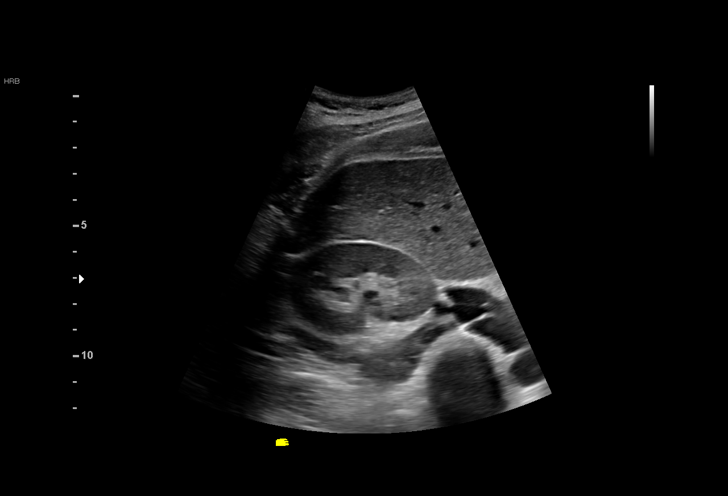
[im 17/28]
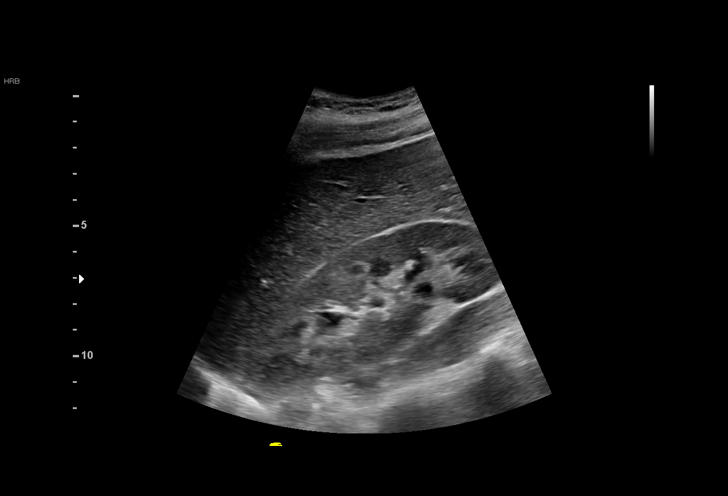
[im 20/28]
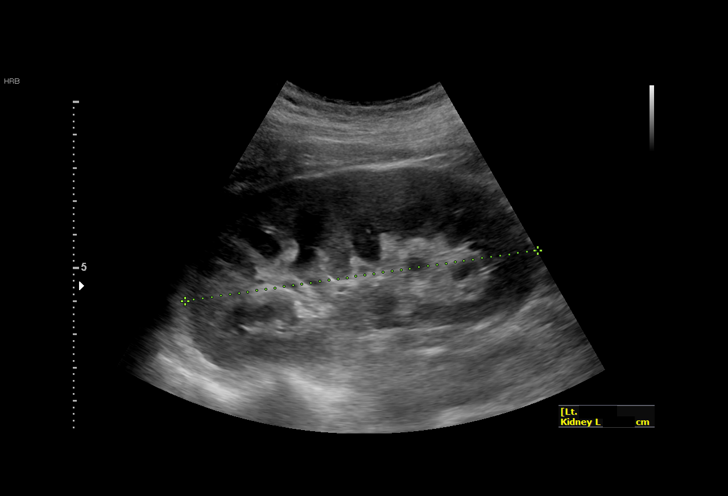
[im 22/28]
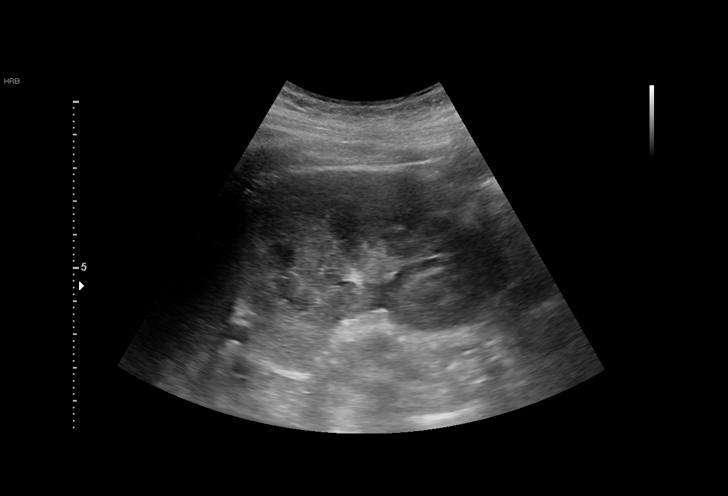
[im 23/28]
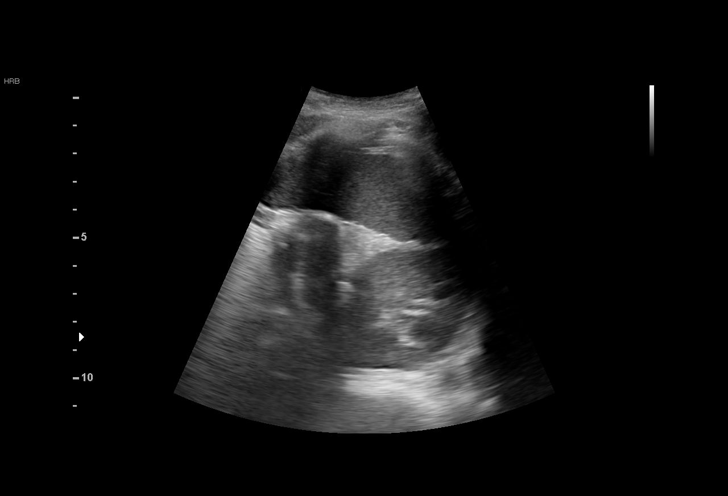
[im 25/28]
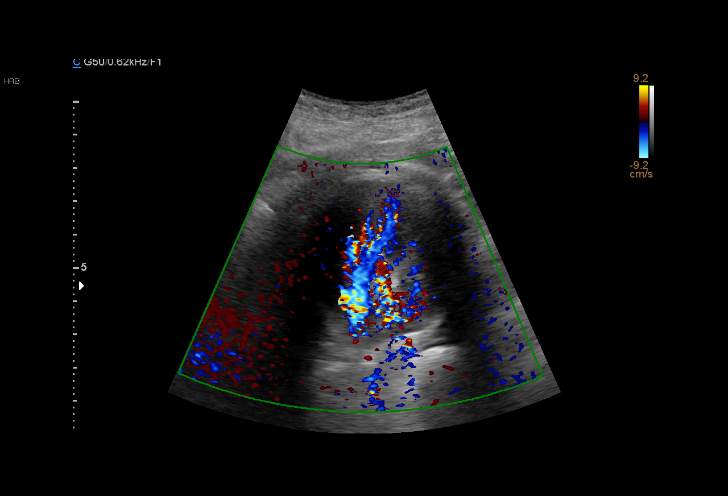
[im 28/28]
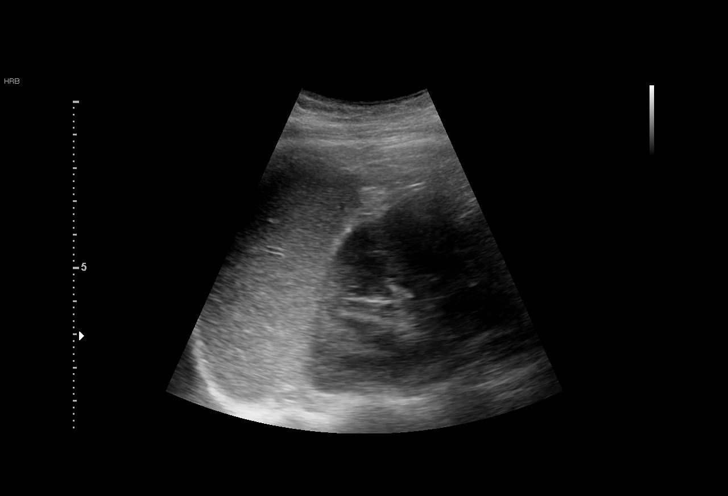

[15 of 25 positions shown; findings below may reference images not displayed]

FINDINGS: Right Kidney:

Length: 12.2 cm, within normal limits. Echogenicity within normal
limits. No mass visualized. Mild right-sided hydronephrosis is
present. No stones are present.

Left Kidney:

Length: 10.7 cm, within normal limits. Echogenicity within normal
limits. No mass or hydronephrosis visualized.

Bladder:

Appears normal for degree of bladder distention. Bilateral ureteral
jets are evident.
IMPRESSION: 1. Mild right-sided hydronephrosis without obstructing stone or mass
lesion. This may be physiologic, related to the pregnancy.
2. Otherwise normal bilateral renal ultrasound.
